# Patient Record
Sex: Female | Born: 1980 | Race: Black or African American | Hispanic: No | Marital: Single | Smoking: Former smoker
Health system: Southern US, Community
[De-identification: ages and names within clinical notes are randomized; demographics above are authoritative.]

## PROBLEM LIST (undated history)

## (undated) DIAGNOSIS — F17200 Nicotine dependence, unspecified, uncomplicated: Secondary | ICD-10-CM

## (undated) HISTORY — DX: Nicotine dependence, unspecified, uncomplicated: F17.200

---

## 2006-06-11 ENCOUNTER — Encounter (INDEPENDENT_AMBULATORY_CARE_PROVIDER_SITE_OTHER): Payer: Self-pay | Admitting: *Deleted

## 2006-06-11 ENCOUNTER — Inpatient Hospital Stay (HOSPITAL_COMMUNITY): Admission: AD | Admit: 2006-06-11 | Discharge: 2006-06-15 | Payer: Self-pay | Admitting: Obstetrics and Gynecology

## 2006-06-15 ENCOUNTER — Inpatient Hospital Stay (HOSPITAL_COMMUNITY): Admission: AD | Admit: 2006-06-15 | Discharge: 2006-06-17 | Payer: Self-pay | Admitting: Obstetrics and Gynecology

## 2007-01-26 ENCOUNTER — Emergency Department (HOSPITAL_COMMUNITY): Admission: EM | Admit: 2007-01-26 | Discharge: 2007-01-26 | Payer: Self-pay | Admitting: *Deleted

## 2007-03-21 IMAGING — US US FETAL BPP W/O NONSTRESS
1 series · 13 of 28 positions shown · non-contrast
Comparison: none

CLINICAL DATA: 34 weeks estimated gestational age with hypertension.  Assess fetal well-being.

[Series 1: us fetal bpp w/o nonstress · 0.33mm/px · 13 of 56 slices shown]
[im 3/56]
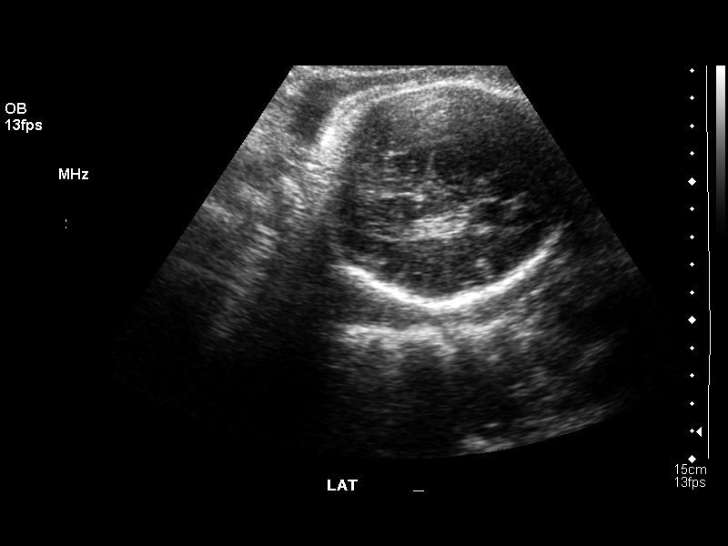
[im 7/56]
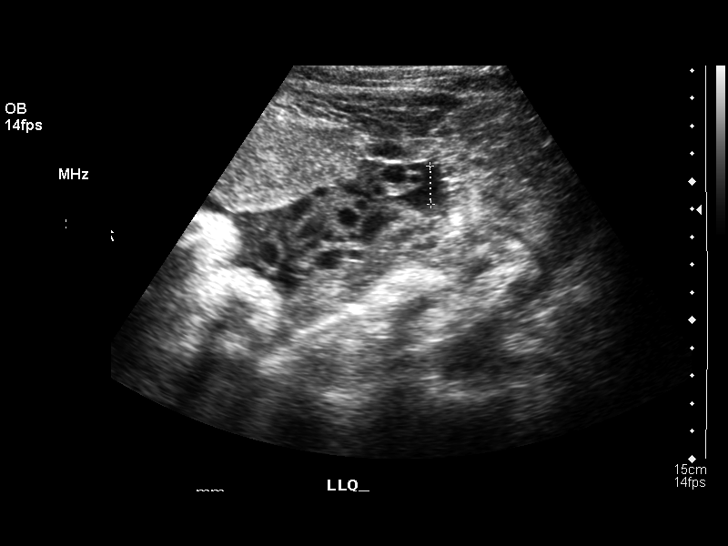
[im 11/56]
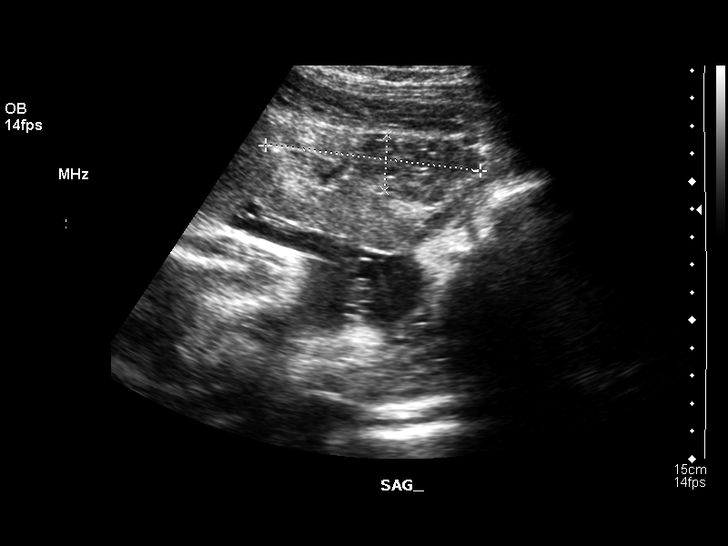
[im 15/56]
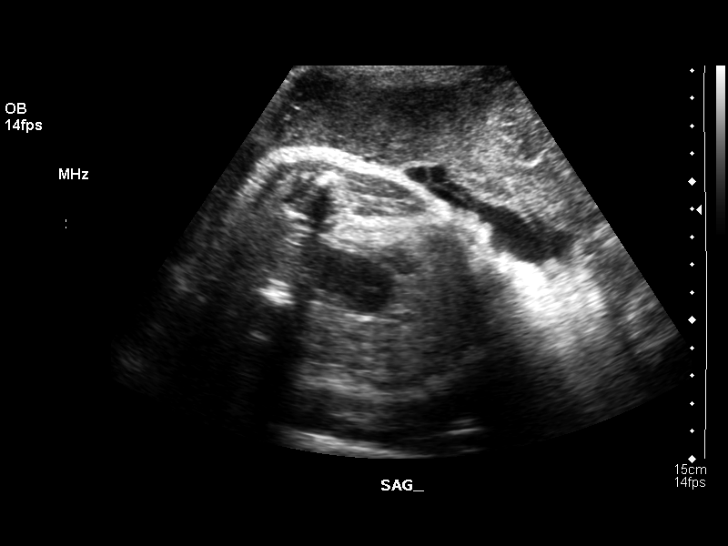
[im 19/56]
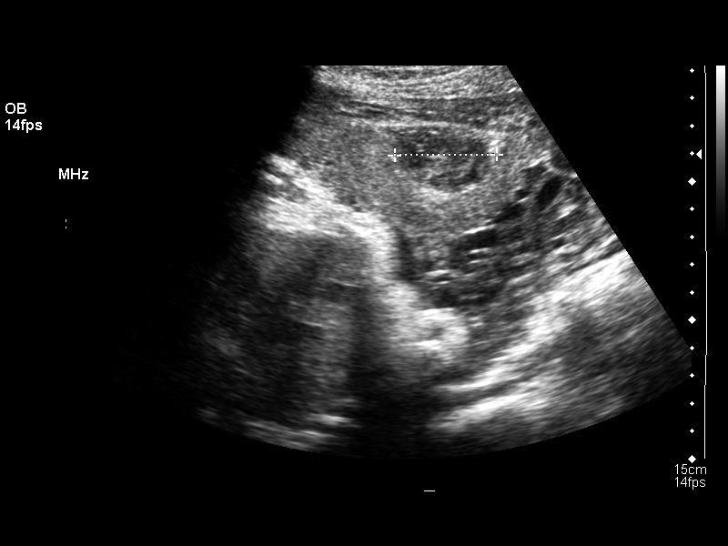
[im 23/56]
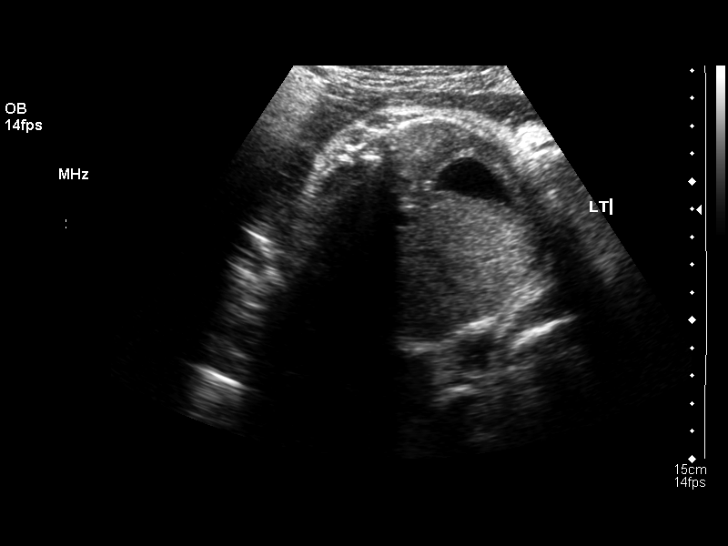
[im 29/56]
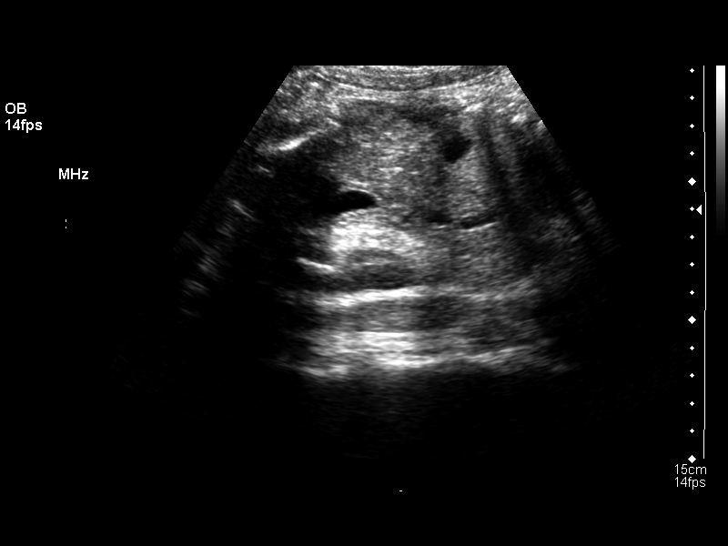
[im 33/56]
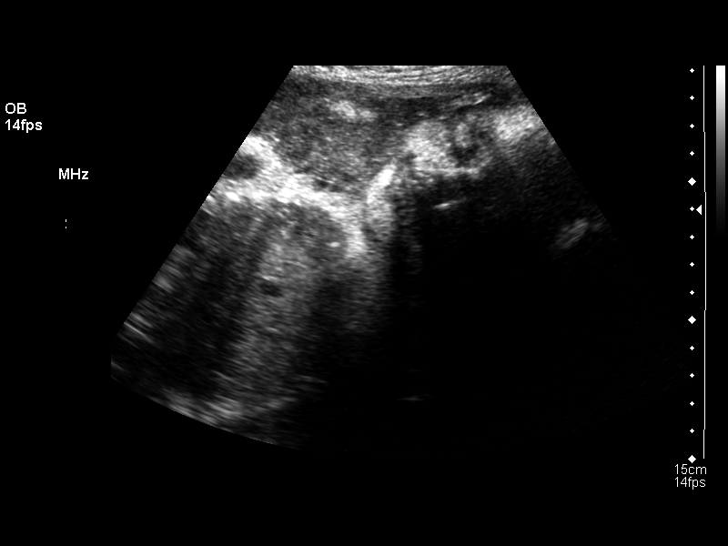
[im 37/56]
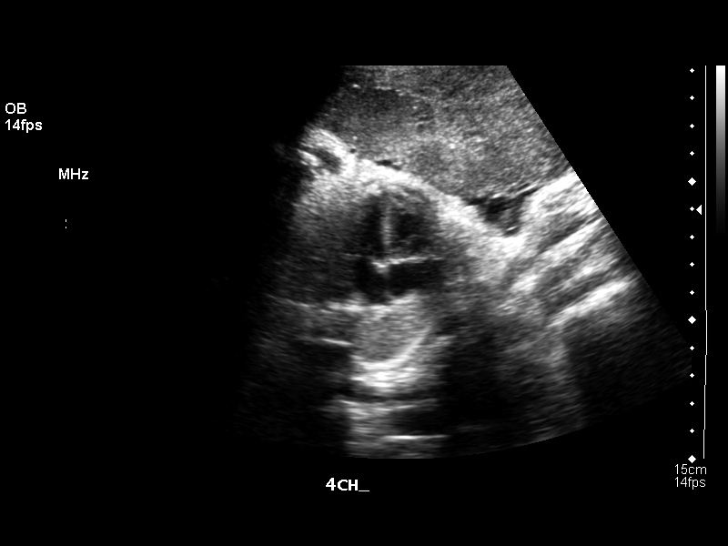
[im 41/56]
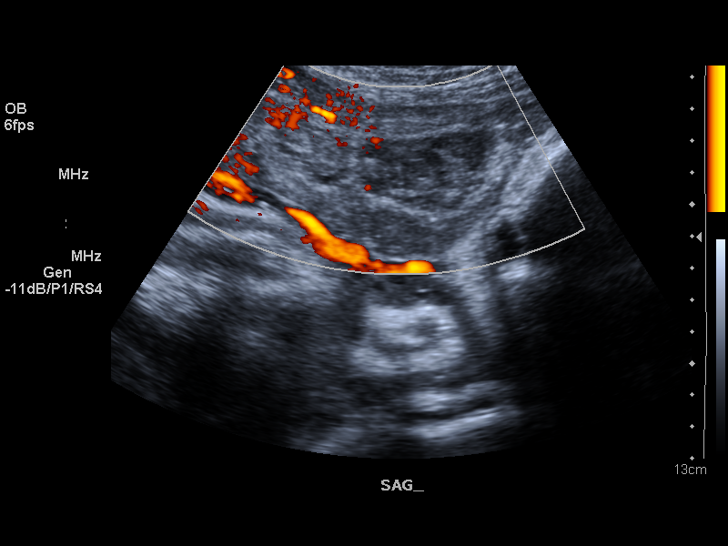
[im 45/56]
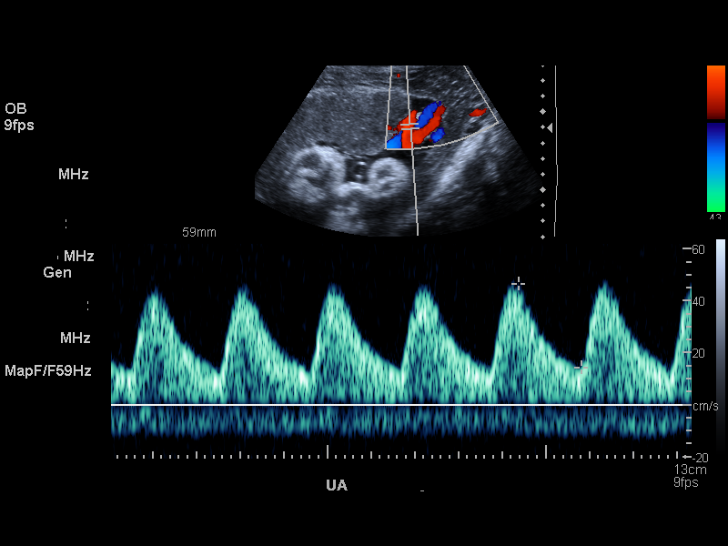
[im 49/56]
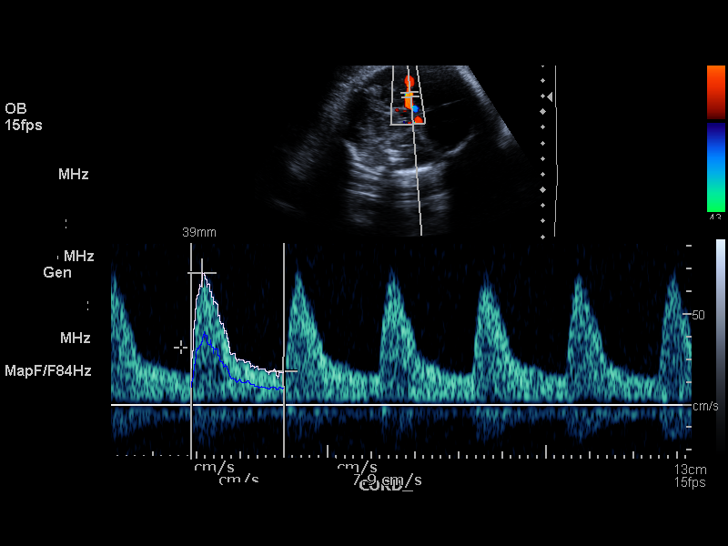
[im 53/56]
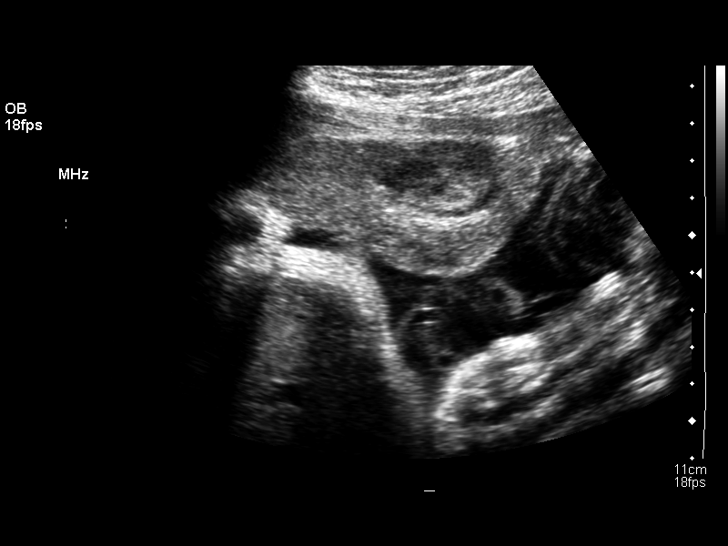

[13 of 28 positions shown; findings below may reference images not displayed]

BIOPHYSICAL PROFILE AND DOPPLER US OF FETUS:

Number of Fetuses:  1
Heart rate:  144 bpm
Movement:  yes
Breathing:  yes
Presentation:  vertex
Placental Location:  anterior
Grade:  I
Previa:  no
Amniotic Fluid (Subjective):  decreased
Amniotic Fluid (Objective):  AFI  7.76 cm (2th-62th %ile = 8.1 to 24.8 cm for 34 weeks) 

Fetal measurements and complete anatomic evaluation were not requested.  The following fetal anatomy was visualized on this exam:  Lateral ventricles, 4-chamber heart, stomach, 3-vessel cord, kidneys, bladder, and aortic arch.

BPP SCORING
Movements:  2  Time:  15 minutes
Breathing:  2
Tone:  2
Amniotic Fluid:  2
Total Score:  8

DOPPLER ULTRASOUND OF FETUS:

Umbilical Artery S/D Ratio:  3.72 (NL< 3.57)

Middle Cerebral Artery PI:  1.58  (NL> 1.35)

MATERNAL UTERINE AND ADNEXAL FINDINGS
Cervix:  Not evaluated.
An inhomogeneous avascular soft tissue collection is identified in a retroplacental position near the placental margin measuring 7.8 x 2.0 x 3.7 cm.  The appearance is worrisome for an old subacute retroplacental hematoma.
IMPRESSION: 1.  Biophysical profile score [DATE].
2.  Elevated umbilical artery systolic/diastolic ratio with a normal middle cerebral artery pulsatility index.  No absence or reversal of end diastolic flow is noted on any of the obtained strips.
3.  Subjectively and quantitatively decreased amniotic fluid volume with an amniotic fluid index today measured at 7.7 cm (8.1 to 24.8 cm).
4.  Findings worrisome for a subacute retroplacental hematoma as described above.  This study was performed as an after hours procedure and subsequently real-time evaluation by a radiologist was not possible.  The patient was sent with a preliminary copy of today?s results.

## 2007-12-11 ENCOUNTER — Other Ambulatory Visit: Admission: RE | Admit: 2007-12-11 | Discharge: 2007-12-11 | Payer: Self-pay | Admitting: Gynecology

## 2008-12-15 ENCOUNTER — Ambulatory Visit: Payer: Self-pay | Admitting: Women's Health

## 2008-12-15 ENCOUNTER — Encounter: Payer: Self-pay | Admitting: Women's Health

## 2008-12-15 ENCOUNTER — Other Ambulatory Visit: Admission: RE | Admit: 2008-12-15 | Discharge: 2008-12-15 | Payer: Self-pay | Admitting: Gynecology

## 2009-01-17 ENCOUNTER — Ambulatory Visit: Payer: Self-pay | Admitting: Women's Health

## 2009-04-07 ENCOUNTER — Ambulatory Visit: Payer: Self-pay | Admitting: Women's Health

## 2009-07-03 ENCOUNTER — Ambulatory Visit: Payer: Self-pay | Admitting: Women's Health

## 2011-03-22 NOTE — Discharge Summary (Signed)
NAMEJENYA, PUTZ              ACCOUNT NO.:  [id number]   MEDICAL RECORD NO.:  [id number]          PATIENT TYPE:  INP   LOCATION:  9320                          [facility]   PHYSICIAN:  Maxie Better, M.D.DATE OF BIRTH:  1981-09-13   DATE OF ADMISSION:  06/11/2006  DATE OF DISCHARGE:  06/15/2006                                 DISCHARGE SUMMARY   ADMISSION DIAGNOSES:  1. Severe preeclampsia.  2. Placenta abruptio.  3. Intrauterine gestation at 34+ weeks.   DISCHARGE DIAGNOSES:  1. Intrauterine gestation at 34+ weeks, delivered.  2. Severe preeclampsia.  3. Placenta abruptio.   PROCEDURE:  A primary cesarean section.   DISCHARGE DIAGNOSES:  1. Severe preeclampsia.  2. Placenta abruption.  3. Oligohydramnios.  4. Intrauterine gestation at 34+ weeks, delivered.   HISTORY OF PRESENT ILLNESS:  A 30 year old, gravida 2, para 0, female at 40-  1/7 weeks, admitted for elevation of blood pressure and proteinuria in the  office.  The patient presented for routine obstetrical care and denied any  headache, visual symptoms, or epigastric pain.  She had no prior history of  hypertension.   HOSPITAL COURSE:  The patient was admitted to the antepartum service.  PIH  labs were obtained that revealed the platelet count of 142,000.  Her  remaining labs were unremarkable.  The patient was given 20 mg of IV  labetalol.  A 24-hour urine collection was started and a biophysical profile  with Dopplers were ordered.  The radiological study was notable for  biophysical of 8/8 with possible small abruption and oligohydramnios.  The  tracing had a fetal heart rate baseline of 140 and reactivity with 1 late  deceleration  and occasional contractions.  The cervix was long, fingertip  and closed.  Presenting part was noted out of pelvis. Given the clinical  findings, the decision was made to proceed with a primary cesarean section.  The patient was taken to the operating room where she  underwent a C-section  via kerr hysterotomy.  The procedure resulted in the delivery of a live  female, 8 pounds 15 ounces.  Apgars were 8 and 8 with about a 40% abruption  noted in the anterior placenta.  A true knot in the cord and a shortened  cord as well.  Normal tubes and ovaries were noted.  The patient was  transferred to the AICU for magnesium sulfate management.  She had  hydrochlorothiazide added to her antihypertensive regimen.  Her blood  pressures have ranged from 140 to 150/92 to 108.  Repeat PIH labs were  obtained.  When she began to diurese, the patient was discharged from the  unit.  She remained in the hospital for continued blood pressure control.  By postop day #4, her blood pressures were 140 to 150/90s.  Her incision had  no erythema, induration, or exudate.  She still had a 1+ edema bilaterally  to the ankles.  Deep tendon reflexes were 2+.  She was thought to be well  enough to be discharged home.   DISPOSITION:  Home.   CONDITION:  Stable.   DISCHARGE  MEDICATIONS:  1. Labetalol 200 mg p.o. t.i.d.  2. Hydrochlorothiazide 50 mg p.o. daily.  3. Percocet 1 to 2 tablets every 3 to 4 hours p.r.n. pain.  4. Motrin 800 mg 1 p.o. q. 6 h. p.r.n. pain.   DISCHARGE FOLLOW UP:  [facility] in the office in 1 week for repeat blood  pressure check and 6 weeks postpartum.   DISCHARGE INSTRUCTIONS:  Per the postpartum booklet given, in addition,  reiteration of PIH warnings.      Maxie Better, M.D.  Electronically Signed     [place]/MEDQ  D:  07/31/2006  T:  08/01/2006  Job:  [id number]

## 2011-03-22 NOTE — Op Note (Signed)
NAMEKAMORIA, LUCIEN              ACCOUNT NO.:  [id number]   MEDICAL RECORD NO.:  [id number]          PATIENT TYPE:  INP   LOCATION:  9159                          [facility]   PHYSICIAN:  Maxie Better, M.D.DATE OF BIRTH:  1981/08/21   DATE OF PROCEDURE:  06/11/2006  DATE OF DISCHARGE:                                 OPERATIVE REPORT   PREOPERATIVE DIAGNOSIS:  Severe pre-eclampsia, placenta abruption,  intrauterine gestation at 2 2/7 weeks.   POSTOPERATIVE DIAGNOSIS:  Severe pre-eclampsia, placenta abruption,  intrauterine gestation at 34 plus weeks.   PROCEDURE:  Primary cesarean section.   ANESTHESIA:  Spinal.   SURGEON:  Maxie Better, M.D.   ASSISTANT:  None.   INDICATIONS FOR PROCEDURE:  This is a 30 year old G2, P0, female at 35 plus  weeks gestation who presented for routine obstetrical care on August 8 who  was found to have a blood pressure of 160/110 and urine protein dip stick of  greater than 500.  The patient also has had a 10 pound weight gain since her  last visit 11 days ago.  The patient had no headaches, blurred vision, or  epigastric pain.  She was sent to the maternity admissions where [facility] labs  were obtained which were notable for a slightly low platelet count, normal  liver function studies, uric acid elevation of 6.3.  The patient was  transferred to the antepartum service.  She was placed on continuous fetal  monitoring which showed nonreactive stress test with occasional contractions  with one late deceleration.  The patient underwent an ultrasound that showed  oligohydramnios and possible placental abruption.  She had an elevated S/D  ratio, biophysical profile was 8 out of 8.  Given the findings, the  recommendation was made for a primary cesarean section as the patient had an  unfavorable cervix which was long, closed, and high in the office.  The  patient had been started on magnesium sulfate on admission to the antepartum  service.  She was also given two doses of intravenous Labetalol for  management of her hypertension.  The risks and benefits of cesarean section  were reviewed with the patient and her significant other, consent was  signed, and the patient was transferred to the operating room.   PROCEDURE:  Under adequate spinal anesthesia, the patient was placed in a  supine position with a left lateral tilt.  She was sterilely prepped and  draped in the usual fashion.  An indwelling Foley catheter was sterilely  placed.  A Pfannenstiel skin incision was then made, carried down to the  rectus fascia, the rectus fascia was opened transversely.  The rectus fascia  was then bluntly and sharply dissected off the rectus muscle in a superior  and inferior fashion.  The rectus muscle was split in the midline and the  parietal peritoneum was opened sharply and extended.  The vesicouterine  peritoneum was opened transversely.  The bladder was carefully bluntly  dissected off the lower uterine segment and displaced inferiorly.  A  curvilinear low transverse uterine incision was then made and extended  bilaterally using  bandage scissors.  Artificial rupture of membranes was  noted, clear scant amniotic fluid was noted.  Subsequent delivery of a live  female was accomplished.  The baby was bulb suctioned on the abdomen, the  cord was clamped and cut, the baby was transferred to the awaiting  pediatricians who assigned Apgars of 8 and 8 at 1 and 5 minutes.  A true  knot was noted in the cord as well as a short cord.  The placenta, which was  spontaneously, showed evidence of about 40% abruption.  The placenta was  sent to pathology.   The uterine cavity was cleaned of debris.  The uterine incision had no  extension.  It was closed in two layers, the first layer 0 Monocryl running  locked stitch, the second layer was imbricated using 0 Monocryl sutures.  Small bleeders were cauterized.  A figure-of-eight was placed  on the right  aspect of the incision due to ongoing bleeding.  With good hemostasis noted,  the abdomen was copiously irrigated and suctioned of debris.  Normal tubes  and ovaries were noted bilaterally.  With good hemostasis noted, the  parietal peritoneum was closed with 2-0 Vicryl.  The rectus fascia was  closed with 0 Vicryl x 2 after small bleeders were cauterized on the rectus  muscles.  The subcutaneous areas were irrigated and suctioned.  Small  bleeders were cauterized.  2-0 plain interrupted sutures were then used to  reapproximate the subcutaneous fat.  The skin was reapproximated using  Ethicon staples.  The specimen was placenta was sent to pathology.  Estimated blood loss was 700 mL.  Interoperative fluid was 1700 mL  crystalloid.  Urine output was 200 mL clear yellow urine.  Sponge and  instrument counts x2 were correct.  Complications were none.  The patient  tolerated the procedure well and was transferred to the recovery room in  stable condition.  The baby was transferred to the NICU with a weight of 3  pounds 15 ounces.      Maxie Better, M.D.  Electronically Signed     [place]/MEDQ  D:  06/11/2006  T:  06/11/2006  Job:  [id number]

## 2011-03-22 NOTE — H&P (Signed)
Hannah Costa, Hannah Costa              ACCOUNT NO.:  [id number]   MEDICAL RECORD NO.:  [id number]          PATIENT TYPE:  INP   LOCATION:  9306                          [facility]   PHYSICIAN:  Richardean Sale, M.D.   DATE OF BIRTH:  08-25-1981   DATE OF ADMISSION:  06/15/2006  DATE OF DISCHARGE:                                HISTORY & PHYSICAL   ADMISSION DIAGNOSES:  Postoperative day #4, status post cesarean section for  severe preeclampsia with headache and elevated blood pressure.   HISTORY OF PRESENT ILLNESS:  This is a 30 year old African-American who is  status post cesarean section at approximately 34 weeks for severe  preeclampsia.  The patient was given magnesium sulfate after delivery and  was discharged to home earlier today on labetalol 200 mg p.o. 3 times a day  and hydrochlorothiazide 50 mg p.o. daily for control of blood pressure.  The  patient went to the pharmacy to fill her prescription at which time she  noticed a frontal headache.  She took her blood pressure, and her diastolic  blood pressure was 117.  She subsequently was sent to maternity admissions.  Upon arrival, she still has a mild to mild frontal headache.  No blurry  vision.  No focal neurologic symptoms.  She denies any epigastric pain.  She  denies any chest pain, shortness of breath, fever, chills, sweats.   PAST MEDICAL HISTORY:  No prior history of hypertension or other  hospitalizations.   PAST SURGICAL HISTORY:  1. Recent cesarean section.  2. D&C for TAB.   OBSTETRIC HISTORY:  Gravida 2, para 0-1-1-1.  Cesarean section at 34 weeks 4  days ago and TAB at 9 weeks.   GYNECOLOGIC HISTORY:  Positive for abnormal Pap smear in 2003.  No history  of sexually transmitted infections.   SOCIAL HISTORY:  She denies tobacco, alcohol, or drugs.   FAMILY HISTORY:  Positive for chronic hypertension, diabetes, seizure  disorder, thrombophlebitis.   MEDICATIONS:  1. Labetalol 200 mg p.o. 3 times a  day.  2. Hydrochlorothiazide 50 mg p.o. daily.  3. Percocet 1-2 tablets p.o. q. 4-6 h p.r.n. pain.  4. Motrin 800 mg p.o. q. 8 h p.r.n. pain.  5. Prenatal vitamin.   PHYSICAL EXAMINATION:  VITAL SIGNS:  Upon arrival, blood pressure is  175/113, down to 165/101.  Pulse 70s to 80s.  Respirations 18.  GENERAL: Well-developed, well-nourished black female who appears in no acute  distress.  HEART: Regular rate and rhythm.  LUNGS: Clear to auscultation bilaterally.  ABDOMEN: Soft, appropriately tender status post C section, nondistended.  No  palpable masses.  Incision is clean, dry, and intact with Steri-Strips.  EXTREMITIES:  No cyanosis or clubbing, 2+ edema in the feet bilaterally.  NEUROLOGIC: Exam is nonfocal, 2+ reflexes bilaterally, and no clonus.   LABORATORY STUDIES:  White count 12.3, hemoglobin 10.2, hematocrit 30.8,  platelet count 135.  AST 40, ALT 23, potassium 4.4.  LDH 290.  Uric acid  6.5.  Urine is negative for protein.   ASSESSMENT:  A 30 year old, gravida 2, para 0-1-1-1, who is now postop  day  #4 status post cesarean section for severe preeclampsia status post  treatment with magnesium sulfate, now with headache and elevated blood  pressure.   PLAN:  1. Will readmit to the third floor for monitoring of blood pressure.  2. Continue labetalol 200 mg p.o. 3 times a day and hydrochlorothiazide 50      mg p.o. daily.  3. Will administer hydralazine 10 mg IV x1.  4. Lactated Ringers at 125 mL an hour.  5. Monitor closely for worsening headache, blurred vision, or epigastric      pain.  6. If blood pressure is unable to be controlled, will consult internal      medicine for additional recommendations.      Richardean Sale, M.D.  Electronically Signed     [facility]/MEDQ  D:  06/15/2006  T:  06/15/2006  Job:  [id number]

## 2011-03-22 NOTE — Discharge Summary (Signed)
Hannah Costa, Hannah Costa              ACCOUNT NO.:  [id number]   MEDICAL RECORD NO.:  [id number]          PATIENT TYPE:  INP   LOCATION:  9306                          [facility]   PHYSICIAN:  Richardean Sale, M.D.   DATE OF BIRTH:  09/13/1981   DATE OF ADMISSION:  06/15/2006  DATE OF DISCHARGE:  06/17/2006                                 DISCHARGE SUMMARY   ADMISSION DIAGNOSIS:  Postpartum hypertension.   DISCHARGE DIAGNOSIS:  Postpartum hypertension.   HOSPITAL COURSE AND HISTORY OF PRESENT ILLNESS:  Please see admission  history and physical for details.   Briefly, this is a 30 year old African-American female who is status post  cesarean section who was discharged to home earlier on the day of this  admission on oral antihypertensive medications as she developed preeclampsia  during this pregnancy.  The patient was discharged to home that morning.  She called later that day when she went to the pharmacy to have her blood  pressure medications filled and her blood pressure was elevated with a  diastolic pressure of 117.  The patient subsequently returned to the  hospital and was admitted for observation and antihypertensive medications.  The patient was subsequently discharged to home on hospital day #3/postop  day #6 after her blood pressure stabilized.   DISPOSITION:  To home.   DISCHARGE CONDITION:  Improved.   DISCHARGE FOLLOWUP:  The patient will follow up in 2-3 days for a blood  pressure check in the office.   DISCHARGE MEDICATIONS:  1. Labetalol 200 mg p.o. three times a day.  2. Hydrochlorothiazide 50 mg p.o. daily.  3. Percocet one to two tabs p.o. q.4-6h. p.r.n. pain.  4. Motrin 800 mg p.o. q.8h. p.r.n. pain.  5. Daily prenatal vitamins.   DISCHARGE INSTRUCTIONS:  The patient is to call for headache, visual  changes, epigastric pain or any blood pressure greater than 160/105.   LABORATORY STUDIES:  On admission, white count 12.3, hemoglobin 10.2,  hematocrit 30.8, platelets 135, AST 40, ALT 23, LDH 290 and uric acid 6.5.      Richardean Sale, M.D.  Electronically Signed     [facility]/MEDQ  D:  08/02/2006  T:  08/04/2006  Job:  [id number]

## 2011-03-22 NOTE — H&P (Signed)
Hannah Costa, Hannah Costa NO.:  [id number]   MEDICAL RECORD NO.:  [id number]          PATIENT TYPE:  MAT   LOCATION:  MATC                          [facility]   PHYSICIAN:  Lenoard Aden, M.D.DATE OF BIRTH:  1981-05-21   DATE OF ADMISSION:  06/11/2006  DATE OF DISCHARGE:                                HISTORY & PHYSICAL   CHIEF COMPLAINT:  Elevated blood pressure.   She is a 30 year old [place] American female, G-2, P-0, with a history of  TAB times one who presents at 17 and 1/7 weeks with elevated blood pressure  and proteinuria in the office.  She denies headaches, visual symptoms or  epigastric pain.  She has no history of elevated blood pressure in the past.   ALLERGIES:  No known drug allergies.   MEDICATIONS:  Prenatal vitamins.   She has a history of abnormal Pap smear in 2003.  She is a nonsmoker and  nondrinker.  She denies domestic physical violence.  She has a family  history of chronic hypertension, diabetes, seizure disorder,  thrombophlebitis.  She has a pertinent history remarkable for a TAB at 9  weeks without complications.  Prenatal lab data is all within normal limits.   PHYSICAL EXAMINATION:  She is a well-developed and well-nourished [place]  American female in no acute distress.  HEENT:  Normal.  NECK:  Supple with full range of motion.  LUNGS:  Clear to auscultation.  HEART:  Regular rate and rhythm.  ABDOMEN:  Soft, gravid, nontender.  No right upper quadrant tenderness.  No  epigastric tenderness noted.  No CVA tenderness noted.  SKIN:  Intact.  NEUROLOGIC:  Exam nonfocal.  DTRs 2-3+ with 1+ pretibial edema and no clonus  noted.   Cervix per Dr. Cherly Hensen is fingertip and vertex per office exam.  NST is  reassuring, but at this time nonreactive, irregular contractions noted.  Biophysical profile and Doppler pending.  PIH panel reveals a platelet count  of 142,000.  Otherwise hemoglobin of 12.  LFTs, creatinine, metabolic  profile, LDH, uric acid all within normal limits with a uric acid of 6.3.   IMPRESSION:  1. A 34 and 1/7 weeks intrauterine pregnancy.  2. Probable severe preeclampsia.   PLAN:  Will admit.  Start 24-hour urine.  Administer labetalol 20 mg IV.  Biophysical profile with Doppler.  Strict I and Os.  Anticipate attempt at  delivery.      Lenoard Aden, M.D.  Electronically Signed     [facility]/MEDQ  D:  06/11/2006  T:  06/11/2006  Job:  [id number]   cc:   Lenoard Aden, M.D.  Fax: [phone]

## 2011-04-05 ENCOUNTER — Other Ambulatory Visit (HOSPITAL_COMMUNITY)
Admission: RE | Admit: 2011-04-05 | Discharge: 2011-04-05 | Disposition: A | Discharge status: Home / Self Care | Payer: 59 | Source: Ambulatory Visit | Attending: Gynecology | Admitting: Gynecology

## 2011-04-05 ENCOUNTER — Other Ambulatory Visit: Payer: Self-pay | Admitting: Women's Health

## 2011-04-05 ENCOUNTER — Encounter (INDEPENDENT_AMBULATORY_CARE_PROVIDER_SITE_OTHER): Payer: 59 | Admitting: Women's Health

## 2011-04-05 DIAGNOSIS — Z113 Encounter for screening for infections with a predominantly sexual mode of transmission: Secondary | ICD-10-CM

## 2011-04-05 DIAGNOSIS — Z01419 Encounter for gynecological examination (general) (routine) without abnormal findings: Secondary | ICD-10-CM

## 2011-04-05 DIAGNOSIS — B373 Candidiasis of vulva and vagina: Secondary | ICD-10-CM

## 2011-04-05 DIAGNOSIS — Z3049 Encounter for surveillance of other contraceptives: Secondary | ICD-10-CM

## 2011-04-05 DIAGNOSIS — N390 Urinary tract infection, site not specified: Secondary | ICD-10-CM

## 2011-04-05 DIAGNOSIS — Z124 Encounter for screening for malignant neoplasm of cervix: Secondary | ICD-10-CM | POA: Insufficient documentation

## 2011-05-01 ENCOUNTER — Ambulatory Visit: Payer: 59 | Admitting: Gynecology

## 2011-10-11 ENCOUNTER — Telehealth: Payer: Self-pay | Admitting: Women's Health

## 2011-10-11 NOTE — Telephone Encounter (Signed)
Telephone call for followup. Had called the on-call doctor about itching on thigh. Saw a dermatologist is now better. But is having slight vaginal itching and requested a Diflucan. Diflucan called to [facility] will call if no relief.

## 2011-10-15 ENCOUNTER — Ambulatory Visit: Payer: 59 | Admitting: Gynecology

## 2011-10-18 ENCOUNTER — Ambulatory Visit: Payer: 59 | Admitting: Women's Health

## 2011-11-01 ENCOUNTER — Other Ambulatory Visit: Payer: Self-pay

## 2011-12-13 ENCOUNTER — Encounter: Payer: Self-pay | Admitting: Women's Health

## 2011-12-13 ENCOUNTER — Ambulatory Visit (INDEPENDENT_AMBULATORY_CARE_PROVIDER_SITE_OTHER): Payer: 59 | Admitting: Women's Health

## 2011-12-13 DIAGNOSIS — L738 Other specified follicular disorders: Secondary | ICD-10-CM

## 2011-12-13 DIAGNOSIS — IMO0001 Reserved for inherently not codable concepts without codable children: Secondary | ICD-10-CM

## 2011-12-13 DIAGNOSIS — L739 Follicular disorder, unspecified: Secondary | ICD-10-CM

## 2011-12-13 DIAGNOSIS — Z309 Encounter for contraceptive management, unspecified: Secondary | ICD-10-CM

## 2011-12-13 MED ORDER — MEDROXYPROGESTERONE ACETATE 150 MG/ML IM SUSP: 150.0000 mg | Freq: Once | INTRAMUSCULAR | Status: DC

## 2011-12-13 MED ORDER — CEPHALEXIN 500 MG PO CAPS: 500.0000 mg | ORAL_CAPSULE | Freq: Three times a day (TID) | ORAL | Status: AC

## 2011-12-13 NOTE — Progress Notes (Signed)
Patient ID: Hannah Costa, female   DOB: 30-Apr-1981, 30 y.o.   MRN: [id number] Presents with the complaint of skin irritation with itching and tenderness on the thighs and buttocks area. Was treated for scabies by dermatologist 3 times. Also requested to start back on Depo-Provera has used in the past without problem. Same partner.  Exam: Healed areas under the axilla,  groin area, inner thighs.Several areas appear to be infected,  folliculitis, tender. No visible scabies noted with visual exam. Skin scraping from area causing itching looked under microscope no visible scabies.  Folliculitis/resolved scabies Contraception management  Plan: Depo-Provera 150 prescription, proper use, currently on cycle instructed to return with medication. Instructed to use condoms especially first month and for infection control. Keflex 500 by mouth 3 times a day for 7 days, instructed to return to office if areas do not resolve. Instructed to wear cotton, loose clothing open to air as able. Hand washing encouraged

## 2011-12-16 ENCOUNTER — Ambulatory Visit (INDEPENDENT_AMBULATORY_CARE_PROVIDER_SITE_OTHER): Payer: 59

## 2011-12-16 DIAGNOSIS — Z3049 Encounter for surveillance of other contraceptives: Secondary | ICD-10-CM

## 2011-12-16 MED ORDER — MEDROXYPROGESTERONE ACETATE 150 MG/ML IM SUSP
150.0000 mg | Freq: Once | INTRAMUSCULAR | Status: AC
  Administered 2011-12-16: 150 mg via INTRAMUSCULAR

## 2012-02-07 ENCOUNTER — Telehealth: Payer: Self-pay | Admitting: *Deleted

## 2012-02-07 ENCOUNTER — Ambulatory Visit: Payer: 59 | Admitting: Women's Health

## 2012-02-07 NOTE — Telephone Encounter (Signed)
Left message for pt to call.

## 2012-02-07 NOTE — Telephone Encounter (Signed)
Pt left message c/o skin infection still there? Left message for pt to call.

## 2012-02-07 NOTE — Telephone Encounter (Signed)
Pt informed to make OV per Harriett Sine note.

## 2012-03-09 ENCOUNTER — Ambulatory Visit (INDEPENDENT_AMBULATORY_CARE_PROVIDER_SITE_OTHER): Payer: 59 | Admitting: *Deleted

## 2012-03-09 DIAGNOSIS — Z3049 Encounter for surveillance of other contraceptives: Secondary | ICD-10-CM

## 2012-03-09 MED ORDER — MEDROXYPROGESTERONE ACETATE 150 MG/ML IM SUSP
150.0000 mg | Freq: Once | INTRAMUSCULAR | Status: AC
  Administered 2012-03-09: 150 mg via INTRAMUSCULAR

## 2012-08-14 ENCOUNTER — Encounter: Payer: Self-pay | Admitting: Women's Health

## 2012-08-14 ENCOUNTER — Ambulatory Visit (INDEPENDENT_AMBULATORY_CARE_PROVIDER_SITE_OTHER): Payer: 59 | Admitting: Women's Health

## 2012-08-14 VITALS — BP 122/78 | Ht 67.0 in | Wt 169.0 lb

## 2012-08-14 DIAGNOSIS — Z01419 Encounter for gynecological examination (general) (routine) without abnormal findings: Secondary | ICD-10-CM

## 2012-08-14 DIAGNOSIS — Z309 Encounter for contraceptive management, unspecified: Secondary | ICD-10-CM

## 2012-08-14 DIAGNOSIS — IMO0001 Reserved for inherently not codable concepts without codable children: Secondary | ICD-10-CM

## 2012-08-14 DIAGNOSIS — N898 Other specified noninflammatory disorders of vagina: Secondary | ICD-10-CM

## 2012-08-14 DIAGNOSIS — Z113 Encounter for screening for infections with a predominantly sexual mode of transmission: Secondary | ICD-10-CM

## 2012-08-14 LAB — CBC WITH DIFFERENTIAL/PLATELET
Basophils Absolute: 0 10*3/uL (ref 0.0–0.1)
Eosinophils Relative: 1 % (ref 0–5)
HCT: 39 % (ref 36.0–46.0)
Lymphocytes Relative: 37 % (ref 12–46)
MCV: 81.4 fL (ref 78.0–100.0)
Monocytes Absolute: 0.9 10*3/uL (ref 0.1–1.0)
RDW: 14.2 % (ref 11.5–15.5)
WBC: 7.9 10*3/uL (ref 4.0–10.5)

## 2012-08-14 LAB — WET PREP FOR TRICH, YEAST, CLUE: Trich, Wet Prep: NONE SEEN

## 2012-08-14 MED ORDER — METRONIDAZOLE 0.75 % VA GEL: VAGINAL | Status: DC

## 2012-08-14 MED ORDER — FLUCONAZOLE 150 MG PO TABS: 150.0000 mg | ORAL_TABLET | Freq: Once | ORAL | Status: DC

## 2012-08-14 MED ORDER — MEDROXYPROGESTERONE ACETATE 150 MG/ML IM SUSP: 150.0000 mg | Freq: Once | INTRAMUSCULAR | Status: DC

## 2012-08-14 NOTE — Patient Instructions (Signed)

## 2012-08-14 NOTE — Progress Notes (Signed)
Karyssa Tozer 1981-05-26 [id number]    History:    The patient presents for annual exam. Has had about a 30 pound weight gain in the last 3 years. Currently on Depo-Provera 150, missed August injection, has not been sexually active for greater than 6 months. History of normal Paps. 1 gardasil 2009   Past medical history, past surgical history, family history and social history were all reviewed and documented in the EPIC chart. Works at [employer]. Daughter Caprice Red 6 doing well. Mother numerous health problems on dialysis/noncompliant with care.   ROS:  A  ROS was performed and pertinent positives and negatives are included in the history.  Exam:  Filed Vitals:   08/14/12 1433  BP: 122/78    General appearance:  Normal Head/Neck:  Normal, without cervical or supraclavicular adenopathy. Thyroid:  Symmetrical, normal in size, without palpable masses or nodularity. Respiratory  Effort:  Normal  Auscultation:  Clear without wheezing or rhonchi Cardiovascular  Auscultation:  Regular rate, without rubs, murmurs or gallops  Edema/varicosities:  Not grossly evident Abdominal  Soft,nontender, without masses, guarding or rebound.  Liver/spleen:  No organomegaly noted  Hernia:  None appreciated  Skin  Inspection:  Grossly normal  Palpation:  Grossly normal Neurologic/psychiatric  Orientation:  Normal with appropriate conversation.  Mood/affect:  Normal  Genitourinary    Breasts: Examined lying and sitting.     Right: Without masses, retractions, discharge or axillary adenopathy.     Left: Without masses, retractions, discharge or axillary adenopathy.   Inguinal/mons:  Normal without inguinal adenopathy  External genitalia:  Normal  BUS/Urethra/Skene's glands:  Normal  Bladder:  Normal  Vagina:  Wet prep positive for yeast, clues and TNTC. bacteria   Cervix:  Normal  Uterus:   normal in size, shape and contour.  Midline and mobile  Adnexa/parametria:     Rt: Without masses or  tenderness.   Lt: Without masses or tenderness.  Anus and perineum: Normal  Digital rectal exam: Normal sphincter tone without palpated masses or tenderness  Assessment/Plan:  31 y.o. SBF G3P1 for annual exam with complaint of vaginal discharge with itching and odor.    BV and yeast Contraception management  Plan: Contraception options reviewed, Mirena IUD information reviewed will check coverage, return to office for Dr. Brayton Layman to place with a cycle if covered.  If not we'll continue on Depo-Provera 150 every 12 weeks, prescription, proper use given continue to be abstinent until injection. Condoms encouraged until permanent partner. MetroGel vaginal cream 1 applicator at bedtime x5 and Diflucan 150 by mouth times one dose with refill. SBE's, exercise, calcium rich diet, MVI daily encouraged. CBC, UA, GC/Chlamydia, HIV, hepatitis B., C., RPR. No Pap history of normal Pap 2012,  reviewed new screening guidelines.   Harrington Challenger [facility], 3:57 PM 08/14/2012

## 2012-08-15 LAB — URINALYSIS W MICROSCOPIC + REFLEX CULTURE
Bacteria, UA: NONE SEEN
Bilirubin Urine: NEGATIVE
Casts: NONE SEEN
Ketones, ur: NEGATIVE mg/dL
Specific Gravity, Urine: 1.023 (ref 1.005–1.030)
pH: 6.5 (ref 5.0–8.0)

## 2013-07-09 ENCOUNTER — Other Ambulatory Visit: Payer: Self-pay | Admitting: Women's Health

## 2013-07-09 ENCOUNTER — Telehealth: Payer: Self-pay

## 2013-07-09 ENCOUNTER — Telehealth: Payer: Self-pay | Admitting: Gynecology

## 2013-07-09 MED ORDER — SULFAMETHOXAZOLE-TRIMETHOPRIM 800-160 MG PO TABS: 1.0000 | ORAL_TABLET | Freq: Two times a day (BID) | ORAL | Status: DC

## 2013-07-09 NOTE — Telephone Encounter (Signed)
Message left

## 2013-07-09 NOTE — Telephone Encounter (Signed)
Patient c/o mild UTI symptoms x 3 days.  (Of note, she had Dr. Velvet Bathe on call paged at 6:20am this morning about this. He recommended to her that she call the office at 9am and make appt.)  She is calling this afternoon because the pain has gone into her side and is becoming unbearable. However, she cannot come in because she is in training for a new job today 8:30am to 5:30pm  She wanted to know if you would prescribe Rx over the phone or recommend something.

## 2013-07-09 NOTE — Telephone Encounter (Signed)
Telephone call to review problem. States when called the on-call doctor only wanted  to leave a message. Apologized for inconvenience. States has been in training all week and  will be in all next week also. States having low abdominal pain, pressure with urination and frequency. States had UTI  several years ago but same symptoms. Denies vaginal discharge, fever. Same partner, monthly cycle. No allergies, Septra DS twice daily for 3 days #6.

## 2013-07-09 NOTE — Telephone Encounter (Signed)
On-call note 6:20 AM: Patient paged the physician on-call with 3 day history of UTI symptoms. Had tried homeopathic remedies but still was having symptoms. Patient was asked to call the office at 9 AM to be seen and evaluated.

## 2013-07-12 ENCOUNTER — Encounter: Payer: Self-pay | Admitting: Women's Health

## 2013-07-12 ENCOUNTER — Ambulatory Visit (INDEPENDENT_AMBULATORY_CARE_PROVIDER_SITE_OTHER): Payer: BC Managed Care – PPO | Admitting: Women's Health

## 2013-07-12 VITALS — Temp 101.6°F

## 2013-07-12 DIAGNOSIS — R8290 Unspecified abnormal findings in urine: Secondary | ICD-10-CM

## 2013-07-12 DIAGNOSIS — N390 Urinary tract infection, site not specified: Secondary | ICD-10-CM

## 2013-07-12 DIAGNOSIS — R82998 Other abnormal findings in urine: Secondary | ICD-10-CM

## 2013-07-12 LAB — URINALYSIS W MICROSCOPIC + REFLEX CULTURE
Bilirubin Urine: NEGATIVE
Casts: NONE SEEN
Crystals: NONE SEEN
Ketones, ur: 15 mg/dL — AB
Specific Gravity, Urine: 1.025 (ref 1.005–1.030)
pH: 6 (ref 5.0–8.0)

## 2013-07-12 MED ORDER — CIPROFLOXACIN HCL 500 MG PO TABS: 500.0000 mg | ORAL_TABLET | Freq: Two times a day (BID) | ORAL | Status: DC

## 2013-07-12 NOTE — Patient Instructions (Addendum)
Urinary Tract Infection  Urinary tract infections (UTIs) can develop anywhere along your urinary tract. Your urinary tract is your body's drainage system for removing wastes and extra water. Your urinary tract includes two kidneys, two ureters, a bladder, and a urethra. Your kidneys are a pair of bean-shaped organs. Each kidney is about the size of your fist. They are located below your ribs, one on each side of your spine.  CAUSES  Infections are caused by microbes, which are microscopic organisms, including fungi, viruses, and bacteria. These organisms are so small that they can only be seen through a microscope. Bacteria are the microbes that most commonly cause UTIs.  SYMPTOMS   Symptoms of UTIs may vary by age and gender of the patient and by the location of the infection. Symptoms in young women typically include a frequent and intense urge to urinate and a painful, burning feeling in the bladder or urethra during urination. Older women and men are more likely to be tired, shaky, and weak and have muscle aches and abdominal pain. A fever may mean the infection is in your kidneys. Other symptoms of a kidney infection include pain in your back or sides below the ribs, nausea, and vomiting.  DIAGNOSIS  To diagnose a UTI, your caregiver will ask you about your symptoms. Your caregiver also will ask to provide a urine sample. The urine sample will be tested for bacteria and white blood cells. White blood cells are made by your body to help fight infection.  TREATMENT   Typically, UTIs can be treated with medication. Because most UTIs are caused by a bacterial infection, they usually can be treated with the use of antibiotics. The choice of antibiotic and length of treatment depend on your symptoms and the type of bacteria causing your infection.  HOME CARE INSTRUCTIONS   If you were prescribed antibiotics, take them exactly as your caregiver instructs you. Finish the medication even if you feel better after you  have only taken some of the medication.   Drink enough water and fluids to keep your urine clear or pale yellow.   Avoid caffeine, tea, and carbonated beverages. They tend to irritate your bladder.   Empty your bladder often. Avoid holding urine for long periods of time.   Empty your bladder before and after sexual intercourse.   After a bowel movement, women should cleanse from front to back. Use each tissue only once.  SEEK MEDICAL CARE IF:    You have back pain.   You develop a fever.   Your symptoms do not begin to resolve within 3 days.  SEEK IMMEDIATE MEDICAL CARE IF:    You have severe back pain or lower abdominal pain.   You develop chills.   You have nausea or vomiting.   You have continued burning or discomfort with urination.  MAKE SURE YOU:    Understand these instructions.   Will watch your condition.   Will get help right away if you are not doing well or get worse.  Document Released: 07/31/2005 Document Revised: 04/21/2012 Document Reviewed: 11/29/2011  [employer] Patient Information 2014 [employer].

## 2013-07-12 NOTE — Progress Notes (Signed)
Patient ID: Natalija Mavis, female   DOB: 1981-05-02, 32 y.o.   MRN: [id number] Presents with fever for 3 days, RLQ and right sided pain, urinary frequency x 4 days and generally not feeling well.  Condoms/same partner. Called the office on Friday 9/5 with urinary symptoms and unable to come to office in work training for a new job, started septra prescription on Friday evening. She noticed some improvement in the urinary frequency but the fever persisted. Went to  employee health on Saturday where her fever was 103. She completed her course of antibiotics but symptoms have persisted. Denies nausea/vomiting,  upper respiratory symptoms, or vaginal symtpoms.  Exam: Well-appearing, abdomen soft, non-tender, non-distended. No rebound or radiation of pain with deep palpation to right lower quadrant.  No CVAT. Speculum exam, no noted erythema or discharge. Bimanual no CMT, no adnexal fullness or tenderness noted. No pain with exam.  UA: Positive nitrites, small leukocytes, 21-50 WBCs, many bacteria.  UTI resistant to Septra  Plan:  Cirpro 500mg  BID x 5 days, no written for work, instructed to rest.  Motrin as needed for fever, instructed to call if symptoms persist. Test of cure UA in 2 weeks appear

## 2014-09-05 ENCOUNTER — Encounter: Payer: Self-pay | Admitting: Women's Health

## 2015-08-11 ENCOUNTER — Encounter: Payer: Self-pay | Admitting: Women's Health

## 2015-08-11 ENCOUNTER — Other Ambulatory Visit (HOSPITAL_COMMUNITY)
Admission: RE | Admit: 2015-08-11 | Discharge: 2015-08-11 | Disposition: A | Discharge status: Home / Self Care | Payer: 59 | Source: Ambulatory Visit | Attending: Women's Health | Admitting: Women's Health

## 2015-08-11 ENCOUNTER — Ambulatory Visit (INDEPENDENT_AMBULATORY_CARE_PROVIDER_SITE_OTHER): Payer: 59 | Admitting: Women's Health

## 2015-08-11 VITALS — BP 110/72 | Ht 67.0 in | Wt 168.0 lb

## 2015-08-11 DIAGNOSIS — Z113 Encounter for screening for infections with a predominantly sexual mode of transmission: Secondary | ICD-10-CM | POA: Diagnosis not present

## 2015-08-11 DIAGNOSIS — Z1151 Encounter for screening for human papillomavirus (HPV): Secondary | ICD-10-CM | POA: Diagnosis not present

## 2015-08-11 DIAGNOSIS — Z01419 Encounter for gynecological examination (general) (routine) without abnormal findings: Secondary | ICD-10-CM | POA: Insufficient documentation

## 2015-08-11 DIAGNOSIS — Z30013 Encounter for initial prescription of injectable contraceptive: Secondary | ICD-10-CM

## 2015-08-11 LAB — WET PREP FOR TRICH, YEAST, CLUE
Clue Cells Wet Prep HPF POC: NONE SEEN
TRICH WET PREP: NONE SEEN
YEAST WET PREP: NONE SEEN

## 2015-08-11 MED ORDER — MEDROXYPROGESTERONE ACETATE 150 MG/ML IM SUSP: 150.0000 mg | Freq: Once | INTRAMUSCULAR | Status: DC

## 2015-08-11 MED ORDER — MEDROXYPROGESTERONE ACETATE 150 MG/ML IM SUSP
150.0000 mg | Freq: Once | INTRAMUSCULAR | Status: AC
  Administered 2015-08-11: 150 mg via INTRAMUSCULAR

## 2015-08-11 NOTE — Patient Instructions (Signed)
Health Maintenance, Female Adopting a healthy lifestyle and getting preventive care can go a long way to promote health and wellness. Talk with your health care provider about what schedule of regular examinations is right for you. This is a good chance for you to check in with your provider about disease prevention and staying healthy. In between checkups, there are plenty of things you can do on your own. Experts have done a lot of research about which lifestyle changes and preventive measures are most likely to keep you healthy. Ask your health care provider for more information. WEIGHT AND DIET  Eat a healthy diet  Be sure to include plenty of vegetables, fruits, low-fat dairy products, and lean protein.  Do not eat a lot of foods high in solid fats, added sugars, or salt.  Get regular exercise. This is one of the most important things you can do for your health.  Most adults should exercise for at least 150 minutes each week. The exercise should increase your heart rate and make you sweat (moderate-intensity exercise).  Most adults should also do strengthening exercises at least twice a week. This is in addition to the moderate-intensity exercise.  Maintain a healthy weight  Body mass index (BMI) is a measurement that can be used to identify possible weight problems. It estimates body fat based on height and weight. Your health care provider can help determine your BMI and help you achieve or maintain a healthy weight.  For females 20 years of age and older:   A BMI below 18.5 is considered underweight.  A BMI of 18.5 to 24.9 is normal.  A BMI of 25 to 29.9 is considered overweight.  A BMI of 30 and above is considered obese.  Watch levels of cholesterol and blood lipids  You should start having your blood tested for lipids and cholesterol at 33 years of age, then have this test every 5 years.  You may need to have your cholesterol levels checked more often if:  Your lipid  or cholesterol levels are high.  You are older than 33 years of age.  You are at high risk for heart disease.  CANCER SCREENING   Lung Cancer  Lung cancer screening is recommended for adults 55-80 years old who are at high risk for lung cancer because of a history of smoking.  A yearly low-dose CT scan of the lungs is recommended for people who:  Currently smoke.  Have quit within the past 15 years.  Have at least a 30-pack-year history of smoking. A pack year is smoking an average of one pack of cigarettes a day for 1 year.  Yearly screening should continue until it has been 15 years since you quit.  Yearly screening should stop if you develop a health problem that would prevent you from having lung cancer treatment.  Breast Cancer  Practice breast self-awareness. This means understanding how your breasts normally appear and feel.  It also means doing regular breast self-exams. Let your health care provider know about any changes, no matter how small.  If you are in your 20s or 30s, you should have a clinical breast exam (CBE) by a health care provider every 1-3 years as part of a regular health exam.  If you are 40 or older, have a CBE every year. Also consider having a breast X-ray (mammogram) every year.  If you have a family history of breast cancer, talk to your health care provider about genetic screening.  If you   are at high risk for breast cancer, talk to your health care provider about having an MRI and a mammogram every year.  Breast cancer gene (BRCA) assessment is recommended for women who have family members with BRCA-related cancers. BRCA-related cancers include:  Breast.  Ovarian.  Tubal.  Peritoneal cancers.  Results of the assessment will determine the need for genetic counseling and BRCA1 and BRCA2 testing. Cervical Cancer Your health care provider may recommend that you be screened regularly for cancer of the pelvic organs (ovaries, uterus, and  vagina). This screening involves a pelvic examination, including checking for microscopic changes to the surface of your cervix (Pap test). You may be encouraged to have this screening done every 3 years, beginning at age 21.  For women ages 30-65, health care providers may recommend pelvic exams and Pap testing every 3 years, or they may recommend the Pap and pelvic exam, combined with testing for human papilloma virus (HPV), every 5 years. Some types of HPV increase your risk of cervical cancer. Testing for HPV may also be done on women of any age with unclear Pap test results.  Other health care providers may not recommend any screening for nonpregnant women who are considered low risk for pelvic cancer and who do not have symptoms. Ask your health care provider if a screening pelvic exam is right for you.  If you have had past treatment for cervical cancer or a condition that could lead to cancer, you need Pap tests and screening for cancer for at least 20 years after your treatment. If Pap tests have been discontinued, your risk factors (such as having a new sexual partner) need to be reassessed to determine if screening should resume. Some women have medical problems that increase the chance of getting cervical cancer. In these cases, your health care provider may recommend more frequent screening and Pap tests. Colorectal Cancer  This type of cancer can be detected and often prevented.  Routine colorectal cancer screening usually begins at 33 years of age and continues through 33 years of age.  Your health care provider may recommend screening at an earlier age if you have risk factors for colon cancer.  Your health care provider may also recommend using home test kits to check for hidden blood in the stool.  A small camera at the end of a tube can be used to examine your colon directly (sigmoidoscopy or colonoscopy). This is done to check for the earliest forms of colorectal  cancer.  Routine screening usually begins at age 50.  Direct examination of the colon should be repeated every 5-10 years through 33 years of age. However, you may need to be screened more often if early forms of precancerous polyps or small growths are found. Skin Cancer  Check your skin from head to toe regularly.  Tell your health care provider about any new moles or changes in moles, especially if there is a change in a mole's shape or color.  Also tell your health care provider if you have a mole that is larger than the size of a pencil eraser.  Always use sunscreen. Apply sunscreen liberally and repeatedly throughout the day.  Protect yourself by wearing long sleeves, pants, a wide-brimmed hat, and sunglasses whenever you are outside. HEART DISEASE, DIABETES, AND HIGH BLOOD PRESSURE   High blood pressure causes heart disease and increases the risk of stroke. High blood pressure is more likely to develop in:  People who have blood pressure in the high end   of the normal range (130-139/85-89 mm Hg).  People who are overweight or obese.  People who are African American.  If you are 38-23 years of age, have your blood pressure checked every 3-5 years. If you are 61 years of age or older, have your blood pressure checked every year. You should have your blood pressure measured twice--once when you are at a hospital or clinic, and once when you are not at a hospital or clinic. Record the average of the two measurements. To check your blood pressure when you are not at a hospital or clinic, you can use:  An automated blood pressure machine at a pharmacy.  A home blood pressure monitor.  If you are between 45 years and 39 years old, ask your health care provider if you should take aspirin to prevent strokes.  Have regular diabetes screenings. This involves taking a blood sample to check your fasting blood sugar level.  If you are at a normal weight and have a low risk for diabetes,  have this test once every three years after 33 years of age.  If you are overweight and have a high risk for diabetes, consider being tested at a younger age or more often. PREVENTING INFECTION  Hepatitis B  If you have a higher risk for hepatitis B, you should be screened for this virus. You are considered at high risk for hepatitis B if:  You were born in a country where hepatitis B is common. Ask your health care provider which countries are considered high risk.  Your parents were born in a high-risk country, and you have not been immunized against hepatitis B (hepatitis B vaccine).  You have HIV or AIDS.  You use needles to inject street drugs.  You live with someone who has hepatitis B.  You have had sex with someone who has hepatitis B.  You get hemodialysis treatment.  You take certain medicines for conditions, including cancer, organ transplantation, and autoimmune conditions. Hepatitis C  Blood testing is recommended for:  Everyone born from [birth year] through 1964.  Anyone with known risk factors for hepatitis C. Sexually transmitted infections (STIs)  You should be screened for sexually transmitted infections (STIs) including gonorrhea and chlamydia if:  You are sexually active and are younger than 33 years of age.  You are older than 33 years of age and your health care provider tells you that you are at risk for this type of infection.  Your sexual activity has changed since you were last screened and you are at an increased risk for chlamydia or gonorrhea. Ask your health care provider if you are at risk.  If you do not have HIV, but are at risk, it may be recommended that you take a prescription medicine daily to prevent HIV infection. This is called pre-exposure prophylaxis (PrEP). You are considered at risk if:  You are sexually active and do not regularly use condoms or know the HIV status of your partner(s).  You take drugs by injection.  You are sexually  active with a partner who has HIV. Talk with your health care provider about whether you are at high risk of being infected with HIV. If you choose to begin PrEP, you should first be tested for HIV. You should then be tested every 3 months for as long as you are taking PrEP.  PREGNANCY   If you are premenopausal and you may become pregnant, ask your health care provider about preconception counseling.  If you may  become pregnant, take 400 to 800 micrograms (mcg) of folic acid every day.  If you want to prevent pregnancy, talk to your health care provider about birth control (contraception). OSTEOPOROSIS AND MENOPAUSE   Osteoporosis is a disease in which the bones lose minerals and strength with aging. This can result in serious bone fractures. Your risk for osteoporosis can be identified using a bone density scan.  If you are 49 years of age or older, or if you are at risk for osteoporosis and fractures, ask your health care provider if you should be screened.  Ask your health care provider whether you should take a calcium or vitamin D supplement to lower your risk for osteoporosis.  Menopause may have certain physical symptoms and risks.  Hormone replacement therapy may reduce some of these symptoms and risks. Talk to your health care provider about whether hormone replacement therapy is right for you.  HOME CARE INSTRUCTIONS   Schedule regular health, dental, and eye exams.  Stay current with your immunizations.   Do not use any tobacco products including cigarettes, chewing tobacco, or electronic cigarettes.  If you are pregnant, do not drink alcohol.  If you are breastfeeding, limit how much and how often you drink alcohol.  Limit alcohol intake to no more than 1 drink per day for nonpregnant women. One drink equals 12 ounces of beer, 5 ounces of wine, or 1 ounces of hard liquor.  Do not use street drugs.  Do not share needles.  Ask your health care provider for help if  you need support or information about quitting drugs.  Tell your health care provider if you often feel depressed.  Tell your health care provider if you have ever been abused or do not feel safe at home.   This information is not intended to replace advice given to you by your health care provider. Make sure you discuss any questions you have with your health care provider.   Document Released: 05/06/2011 Document Revised: 11/11/2014 Document Reviewed: 09/22/2013 Elsevier Interactive Patient Education 2016 [employer]. Medroxyprogesterone injection [Contraceptive] What is this medicine? MEDROXYPROGESTERONE (me DROX ee proe JES te rone) contraceptive injections prevent pregnancy. They provide effective birth control for 3 months. Depo-subQ Provera 104 is also used for treating pain related to endometriosis. This medicine may be used for other purposes; ask your health care provider or pharmacist if you have questions. What should I tell my health care provider before I take this medicine? They need to know if you have any of these conditions: -frequently drink alcohol -asthma -blood vessel disease or a history of a blood clot in the lungs or legs -bone disease such as osteoporosis -breast cancer -diabetes -eating disorder (anorexia nervosa or bulimia) -high blood pressure -HIV infection or AIDS -kidney disease -liver disease -mental depression -migraine -seizures (convulsions) -stroke -tobacco smoker -vaginal bleeding -an unusual or allergic reaction to medroxyprogesterone, other hormones, medicines, foods, dyes, or preservatives -pregnant or trying to get pregnant -breast-feeding How should I use this medicine? Depo-Provera Contraceptive injection is given into a muscle. Depo-subQ Provera 104 injection is given under the skin. These injections are given by a health care professional. You must not be pregnant before getting an injection. The injection is usually given during  the first 5 days after the start of a menstrual period or 6 weeks after delivery of a baby. Talk to your pediatrician regarding the use of this medicine in children. Special care may be needed. These injections have been used in  female children who have started having menstrual periods. Overdosage: If you think you have taken too much of this medicine contact a poison control center or emergency room at once. NOTE: This medicine is only for you. Do not share this medicine with others. What if I miss a dose? Try not to miss a dose. You must get an injection once every 3 months to maintain birth control. If you cannot keep an appointment, call and reschedule it. If you wait longer than 13 weeks between Depo-Provera contraceptive injections or longer than 14 weeks between Depo-subQ Provera 104 injections, you could get pregnant. Use another method for birth control if you miss your appointment. You may also need a pregnancy test before receiving another injection. What may interact with this medicine? Do not take this medicine with any of the following medications: -bosentan This medicine may also interact with the following medications: -aminoglutethimide -antibiotics or medicines for infections, especially rifampin, rifabutin, rifapentine, and griseofulvin -aprepitant -barbiturate medicines such as phenobarbital or primidone -bexarotene -carbamazepine -medicines for seizures like ethotoin, felbamate, oxcarbazepine, phenytoin, topiramate -modafinil -St. John's wort This list may not describe all possible interactions. Give your health care provider a list of all the medicines, herbs, non-prescription drugs, or dietary supplements you use. Also tell them if you smoke, drink alcohol, or use illegal drugs. Some items may interact with your medicine. What should I watch for while using this medicine? This drug does not protect you against HIV infection (AIDS) or other sexually transmitted  diseases. Use of this product may cause you to lose calcium from your bones. Loss of calcium may cause weak bones (osteoporosis). Only use this product for more than 2 years if other forms of birth control are not right for you. The longer you use this product for birth control the more likely you will be at risk for weak bones. Ask your health care professional how you can keep strong bones. You may have a change in bleeding pattern or irregular periods. Many females stop having periods while taking this drug. If you have received your injections on time, your chance of being pregnant is very low. If you think you may be pregnant, see your health care professional as soon as possible. Tell your health care professional if you want to get pregnant within the next year. The effect of this medicine may last a long time after you get your last injection. What side effects may I notice from receiving this medicine? Side effects that you should report to your doctor or health care professional as soon as possible: -allergic reactions like skin rash, itching or hives, swelling of the face, lips, or tongue -breast tenderness or discharge -breathing problems -changes in vision -depression -feeling faint or lightheaded, falls -fever -pain in the abdomen, chest, groin, or leg -problems with balance, talking, walking -unusually weak or tired -yellowing of the eyes or skin Side effects that usually do not require medical attention (report to your doctor or health care professional if they continue or are bothersome): -acne -fluid retention and swelling -headache -irregular periods, spotting, or absent periods -temporary pain, itching, or skin reaction at site where injected -weight gain This list may not describe all possible side effects. Call your doctor for medical advice about side effects. You may report side effects to FDA at 1-800-FDA-1088. Where should I keep my medicine? This does not apply.  The injection will be given to you by a health care professional. NOTE: This sheet is a summary. It may  not cover all possible information. If you have questions about this medicine, talk to your doctor, pharmacist, or health care provider.    2016, Elsevier/[employer]. (2008-11-11 18:37:56)

## 2015-08-11 NOTE — Progress Notes (Signed)
Patient ID: Hannah Costa, female   DOB: 1981/10/02, 34 y.o.   MRN: [id number] Hannah Costa [birth year] [id number]    History:    Presents for annual exam, last seen 2013.  Has noticed some cloudy discharge and odor the last 4 days and has started her period today.  Regular monthly cycles, using condoms, same partner/years requests STD screen.  Cramping on day one that is relieved with ibuprofen.  Normal pap history.  2 gardisil in 2009.  Interested in restarting depo-provera. Does not desire second child.  Past medical history, past surgical history, family history and social history were all reviewed and documented in the EPIC chart.  Working in Clinical biochemist for [employer].  Light tobacco use.  Mother died from kidney and heart failure, Father alive and well.  Brother hit and killed by a car in January.  27 year old daughter doing well in 4th grade, having some trouble coping with uncle's death.    ROS:  A ROS was performed and pertinent positives and negatives are included.  Exam:  Filed Vitals:   08/11/15 1536  BP: 110/72    General appearance:  Normal Thyroid:  Symmetrical, normal in size, without palpable masses or nodularity. Respiratory  Auscultation:  Clear without wheezing or rhonchi Cardiovascular  Auscultation:  Regular rate, without rubs, murmurs or gallops  Edema/varicosities:  Not grossly evident Abdominal  Soft,nontender, without masses, guarding or rebound.  Liver/spleen:  No organomegaly noted  Hernia:  None appreciated  Skin  Inspection:  Grossly normal   Breasts: Examined lying and sitting.     Right: Without masses, retractions, discharge or axillary adenopathy.     Left: Without masses, retractions, discharge or axillary adenopathy. Gentitourinary   Inguinal/mons:  Normal without inguinal adenopathy  External genitalia:  Normal  BUS/Urethra/Skene's glands:  Normal  Vagina:  Normal, menses Wet Prep: Negative  Cervix:  Normal  Uterus:  Normal in  size, shape and contour.  Midline and mobile  Adnexa/parametria:     Rt: Without masses or tenderness.   Lt: Without masses or tenderness.  Anus and perineum: Normal   Assessment/Plan:  34 y.o.  SBF G3P63for annual exam.   Normal GYN exam STI Screen Contraception  Plan:  Prescribed Depo-Provera 150 mg IM injection, first injection given today, return to office in 12 weeks, reviewed may have spotting.  Discussed need for continued condom use for the next month for pregnancy prevention and thereafter until permanent partner.  Pap, with HR HPV, GC/Chlamydia, RPR, HIV, Hep C and B Pending.  Discussed not douching or using perfumed genital products.  Discussed smoking cessation, SBE's, exercise, healthy diet, daily MVI. Wet prep negative, reviewed Discharge likely physiologic, will monitor symptoms at home and call if symptoms persist or worsen.      [facility], 4:19 PM 08/11/2015

## 2015-08-12 LAB — HEPATITIS C ANTIBODY: HCV AB: NEGATIVE

## 2015-08-12 LAB — HEPATITIS B SURFACE ANTIGEN: Hepatitis B Surface Ag: NEGATIVE

## 2015-08-12 LAB — GC/CHLAMYDIA PROBE AMP
CT PROBE, AMP APTIMA: NEGATIVE
GC PROBE AMP APTIMA: NEGATIVE

## 2015-08-12 LAB — RPR

## 2015-08-12 LAB — HIV ANTIBODY (ROUTINE TESTING W REFLEX): HIV: NONREACTIVE

## 2015-08-15 LAB — CYTOLOGY - PAP

## 2015-11-10 ENCOUNTER — Ambulatory Visit (INDEPENDENT_AMBULATORY_CARE_PROVIDER_SITE_OTHER): Payer: 59 | Admitting: Gynecology

## 2015-11-10 DIAGNOSIS — Z3042 Encounter for surveillance of injectable contraceptive: Secondary | ICD-10-CM

## 2015-11-10 MED ORDER — MEDROXYPROGESTERONE ACETATE 150 MG/ML IM SUSP
150.0000 mg | Freq: Once | INTRAMUSCULAR | Status: AC
  Administered 2015-11-10: 150 mg via INTRAMUSCULAR

## 2016-08-01 ENCOUNTER — Ambulatory Visit (INDEPENDENT_AMBULATORY_CARE_PROVIDER_SITE_OTHER): Payer: 59 | Admitting: Urgent Care

## 2016-08-01 VITALS — BP 98/68 | HR 91 | Temp 98.2°F | Ht 67.1 in | Wt 173.0 lb

## 2016-08-01 DIAGNOSIS — Z0000 Encounter for general adult medical examination without abnormal findings: Secondary | ICD-10-CM | POA: Diagnosis not present

## 2016-08-01 DIAGNOSIS — Z8249 Family history of ischemic heart disease and other diseases of the circulatory system: Secondary | ICD-10-CM | POA: Diagnosis not present

## 2016-08-01 DIAGNOSIS — Z23 Encounter for immunization: Secondary | ICD-10-CM | POA: Diagnosis not present

## 2016-08-01 LAB — COMPREHENSIVE METABOLIC PANEL
ALBUMIN: 4 g/dL (ref 3.6–5.1)
ALK PHOS: 62 U/L (ref 33–115)
ALT: 8 U/L (ref 6–29)
AST: 13 U/L (ref 10–30)
BILIRUBIN TOTAL: 0.4 mg/dL (ref 0.2–1.2)
BUN: 8 mg/dL (ref 7–25)
CALCIUM: 9.4 mg/dL (ref 8.6–10.2)
CO2: 22 mmol/L (ref 20–31)
Chloride: 109 mmol/L (ref 98–110)
Creat: 0.69 mg/dL (ref 0.50–1.10)
GLUCOSE: 89 mg/dL (ref 65–99)
Potassium: 4.3 mmol/L (ref 3.5–5.3)
Sodium: 139 mmol/L (ref 135–146)
TOTAL PROTEIN: 7.5 g/dL (ref 6.1–8.1)

## 2016-08-01 LAB — LIPID PANEL
CHOLESTEROL: 150 mg/dL (ref 125–200)
HDL: 43 mg/dL — AB (ref >=46)
LDL Cholesterol: 95 mg/dL (ref <130)
Total CHOL/HDL Ratio: 3.5 Ratio (ref <=5.0)
Triglycerides: 61 mg/dL (ref <150)
VLDL: 12 mg/dL (ref <30)

## 2016-08-01 LAB — CBC
HEMATOCRIT: 39.9 % (ref 35.0–45.0)
HEMOGLOBIN: 13.7 g/dL (ref 11.7–15.5)
MCH: 28.7 pg (ref 27.0–33.0)
MCHC: 34.3 g/dL (ref 32.0–36.0)
MCV: 83.6 fL (ref 80.0–100.0)
MPV: 10.6 fL (ref 7.5–12.5)
Platelets: 226 10*3/uL (ref 140–400)
RBC: 4.77 MIL/uL (ref 3.80–5.10)
RDW: 14 % (ref 11.0–15.0)
WBC: 6.6 10*3/uL (ref 3.8–10.8)

## 2016-08-01 LAB — TSH: TSH: 1.26 m[IU]/L

## 2016-08-01 NOTE — Progress Notes (Addendum)
MRN: [id number]  Subjective:   Hannah Costa is a 35 y.o. female presenting for annual physical exam.  Medical care team includes: PCP: No primary care provider on file. Vision: No visual deficits. Dental: Cleanings every 6 months. OB/GYN: History of C-section, abnormal pap smears. Last pap smear was 08/2015, is on 1 year pap smear repeats. Specialists: None.  Patient is currently single, has one 66 year old daughter, lives on her own with daughter. Has good relationships and support network at home. 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 Denies smoking cigarettes or drinking alcohol. Has a 3 pack year smoking history, quit in 07/2015.    Hannah Costa does not have any active problems on his problem list.   Hannah Costa is not currently taking any medications. She has No Known Allergies.  Hannah Costa  has a past medical history of Smoker. Also  has a past surgical history that includes Cesarean section.  Her family history includes Diabetes in her maternal grandmother; Hypertension in her maternal grandfather and maternal grandmother.  Immunizations: Tdap will be updated today. Patient refused influenza vaccine today, plans on getting this with her daughter.  Review of Systems  Constitutional: Negative for chills, diaphoresis, fever, malaise/fatigue and weight loss.  HENT: Negative for congestion, ear discharge, ear pain, hearing loss, nosebleeds, sore throat and tinnitus.   Eyes: Negative for blurred vision, double vision, photophobia, pain, discharge and redness.  Respiratory: Negative for cough, shortness of breath and wheezing.   Cardiovascular: Negative for chest pain, palpitations and leg swelling.  Gastrointestinal: Negative for abdominal pain, blood in stool, constipation, diarrhea, nausea and vomiting.  Genitourinary: Negative for dysuria, flank pain, frequency, hematuria and urgency.  Musculoskeletal: Negative for back pain, joint pain and myalgias.  Skin:  Negative for itching and rash.  Neurological: Negative for dizziness, tingling, seizures, loss of consciousness, weakness and headaches.  Endo/Heme/Allergies: Negative for polydipsia.  Psychiatric/Behavioral: Negative for depression, hallucinations, memory loss, substance abuse and suicidal ideas. The patient is not nervous/anxious and does not have insomnia.    Objective:   Vitals: BP 98/68 (BP Location: Right Arm, Patient Position: Sitting, Cuff Size: Large)   Pulse 91   Temp 98.2 F (36.8 C) (Oral)   Ht 5' 7.1" (1.704 m)   Wt 173 lb (78.5 kg)   SpO2 98%   BMI 27.01 kg/m    Visual Acuity Screening   Right eye Left eye Both eyes  Without correction:     With correction: 20/25 20/20 20/20     Physical Exam  Constitutional: She is oriented to person, place, and time. She appears well-developed and well-nourished.  HENT:  TM's intact bilaterally, no effusions or erythema. Nasal turbinates pink and moist, nasal passages patent. No sinus tenderness. Oropharynx clear, mucous membranes moist, dentition in good repair.  Eyes: Conjunctivae and EOM are normal. Pupils are equal, round, and reactive to light. Right eye exhibits no discharge. Left eye exhibits no discharge. No scleral icterus.  Neck: Normal range of motion. Neck supple. No thyromegaly present.  Cardiovascular: Normal rate, regular rhythm and intact distal pulses.  Exam reveals no gallop and no friction rub.   No murmur heard. Pulmonary/Chest: No respiratory distress. She has no wheezes. She has no rales.  Abdominal: Soft. Bowel sounds are normal. She exhibits no distension and no mass. There is no tenderness.  Musculoskeletal: Normal range of motion. She exhibits no edema or tenderness.  Lymphadenopathy:    She has no cervical adenopathy.  Neurological: She is alert  and oriented to person, place, and time. She has normal reflexes.  Skin: Skin is warm and dry. No rash noted. No erythema. No pallor.  Psychiatric: She has a  normal mood and affect.   Assessment and Plan :   1. Annual physical exam - Patient is medically healthy, I congratulated her on her efforts to be healthy including smoking cessation. Labs are pending. I will complete paper work as appropriate. - Discussed healthy lifestyle, diet, exercise, preventative care, vaccinations, and addressed patient's concerns.   2. Need for prophylactic vaccination with combined diphtheria-tetanus-pertussis (DTP) vaccine - Tdap vaccine greater than or equal to 7yo IM  3. Family history of heart disease - Monitor  Wallis BambergMario Mariapaula Krist, New JerseyPA-C [facility] [phone] 08/01/2016  8:42 AM

## 2016-08-01 NOTE — Patient Instructions (Addendum)
Keeping You Healthy  Get These Tests 1. Blood Pressure- Have your blood pressure checked once a year by your health care provider.  Normal blood pressure is 120/80. 2. Weight- Have your body mass index (BMI) calculated to screen for obesity.  BMI is measure of body fat based on height and weight.  You can also calculate your own BMI at www.nhlbisupport.com/bmi/. 3. Cholesterol- Have your cholesterol checked every 5 years starting at age 34 then yearly starting at age 45. 4. Chlamydia, HIV, and other sexually transmitted diseases- Get screened every year until age 25, then within three months of each new sexual provider. 5. Pap Test - Every 1-5 years; discuss with your health care provider. 6. Mammogram- Every 1-2 years starting at age 40--50  Take these medicines  Calcium with Vitamin D-Your body needs 1200 mg of Calcium each day and 800-1000 IU of Vitamin D daily.  Your body can only absorb 500 mg of Calcium at a time so Calcium must be taken in 2 or 3 divided doses throughout the day.  Multivitamin with folic acid- Once daily if it is possible for you to become pregnant.  Get these Immunizations  Gardasil-Series of three doses; prevents HPV related illness such as genital warts and cervical cancer.  Menactra-Single dose; prevents meningitis.  Tetanus shot- Every 10 years.  Flu shot-Every year.  Take these steps 1. Do not smoke-Your healthcare provider can help you quit.  For tips on how to quit go to www.smokefree.gov or call 1-800 QUITNOW. 2. Be physically active- Exercise 5 days a week for at least 30 minutes.  If you are not already physically active, start slow and gradually work up to 30 minutes of moderate physical activity.  Examples of moderate activity include walking briskly, dancing, swimming, bicycling, etc. 3. Breast Cancer- A self breast exam every month is important for early detection of breast cancer.  For more information and instruction on self breast exams, ask your  healthcare provider or www.womenshealth.gov/faq/breast-self-exam.cfm. 4. Eat a healthy diet- Eat a variety of healthy foods such as fruits, vegetables, whole grains, low fat milk, low fat cheeses, yogurt, lean meats, poultry and fish, beans, nuts, tofu, etc.  For more information go to www. Thenutritionsource.org 5. Drink alcohol in moderation- Limit alcohol intake to one drink or less per day. Never drink and drive. 6. Depression- Your emotional health is as important as your physical health.  If you're feeling down or losing interest in things you normally enjoy please talk to your healthcare provider about being screened for depression. 7. Dental visit- Brush and floss your teeth twice daily; visit your dentist twice a year. 8. Eye doctor- Get an eye exam at least every 2 years. 9. Helmet use- Always wear a helmet when riding a bicycle, motorcycle, rollerblading or skateboarding. 10. Safe sex- If you may be exposed to sexually transmitted infections, use a condom. 11. Seat belts- Seat belts can save your live; always wear one. 12. Smoke/Carbon Monoxide detectors- These detectors need to be installed on the appropriate level of your home. Replace batteries at least once a year. 13. Skin cancer- When out in the sun please cover up and use sunscreen 15 SPF or higher. 14. Violence- If anyone is threatening or hurting you, please tell your healthcare provider.           IF you received an x-ray today, you will receive an invoice from [facility]. Please contact [facility] at [phone] with questions or concerns regarding your invoice.     IF you received labwork today, you will receive an invoice from [facility]. Please contact [facility] at [phone] with questions or concerns regarding your invoice.   Our billing staff will not be able to assist you with questions regarding bills from these companies.  You will be contacted with the lab  results as soon as they are available. The fastest way to get your results is to activate your My Chart account. Instructions are located on the last page of this paperwork. If you have not heard from us regarding the results in 2 weeks, please contact this office.     

## 2016-08-02 ENCOUNTER — Encounter: Payer: Self-pay | Admitting: Urgent Care

## 2016-08-03 ENCOUNTER — Telehealth: Payer: Self-pay

## 2016-08-03 NOTE — Telephone Encounter (Signed)
Spoke with pt.   Form at front desk for pt to sign,  We will give copy,  Then fax form to number highlighted on form Then place form in box for scan.  Mani completed form.

## 2016-09-21 ENCOUNTER — Ambulatory Visit (INDEPENDENT_AMBULATORY_CARE_PROVIDER_SITE_OTHER): Payer: 59 | Admitting: Physician Assistant

## 2016-09-21 VITALS — BP 120/70 | HR 86 | Temp 98.3°F | Resp 16 | Ht 67.5 in | Wt 177.0 lb

## 2016-09-21 DIAGNOSIS — H00012 Hordeolum externum right lower eyelid: Secondary | ICD-10-CM

## 2016-09-21 MED ORDER — POLYMYXIN B-TRIMETHOPRIM 10000-0.1 UNIT/ML-% OP SOLN: 1.0000 [drp] | OPHTHALMIC | 0 refills | Status: DC

## 2016-09-21 NOTE — Patient Instructions (Addendum)
The ibuprofen 600 mg three times daily with food. Continue your home care.  If you're not seeing significant improvement in 48 hours, start the antibiotic drops. If you don't see significant improvement after 2 days on the drops, contact your eye specialist.    IF you received an x-ray today, you will [facility]. Please contact [facility] at [phone] with questions or concerns regarding your invoice.   IF you received labwork today, you will receive an invoice from [facility]. Please contact [facility] at [phone] with questions or concerns regarding your invoice.   Our billing staff will not be able to assist you with questions regarding bills from these companies.  You will be contacted with the lab results as soon as they are available. The fastest way to get your results is to activate your My Chart account. Instructions are located on the last page of this paperwork. If you have not heard from us regarding the results in 2 weeks, please contact this office.    Stye A stye is a bump on your eyelid caused by a bacterial infection. A stye can form inside the eyelid (internal stye) or outside the eyelid (external stye). An internal stye may be caused by an infected oil-producing gland inside your eyelid. An external stye may be caused by an infection at the base of your eyelash (hair follicle). Styes are very common. Anyone can get them at any age. They usually occur in just one eye, but you may have more than one in either eye. What are the causes? The infection is almost always caused by bacteria called Staphylococcus aureus. This is a common type of bacteria that lives on your skin. What increases the risk? You may be at higher risk for a stye if you have had one before. You may also be at higher risk if you have:  Diabetes.  Long-term illness.  Long-term eye redness.  A skin condition called  seborrhea.  High fat levels in your blood (lipids). What are the signs or symptoms? Eyelid pain is the most common symptom of a stye. Internal styes are more painful than external styes. Other signs and symptoms may include:  Painful swelling of your eyelid.  A scratchy feeling in your eye.  Tearing and redness of your eye.  Pus draining from the stye. How is this diagnosed? Your health care provider may be able to diagnose a stye just by examining your eye. The health care provider may also check to make sure:  You do not have a fever or other signs of a more serious infection.  The infection has not spread to other parts of your eye or areas around your eye. How is this treated? Most styes will clear up in a few days without treatment. In some cases, you may need to use antibiotic drops or ointment to prevent infection. Your health care provider may have to drain the stye surgically if your stye is:  Large.  Causing a lot of pain.  Interfering with your vision. This can be done using a thin blade or a needle. Follow these instructions at home:  Take medicines only as directed by your health care provider.  Apply a clean, warm compress to your eye for 10 minutes, 4 times a day.  Do not wear contact lenses or eye makeup until your stye has healed.  Do not try to pop or drain the stye. Contact a health care provider if:  You have  chills or a fever.  Your stye does not go away after several days.  Your stye affects your vision.  Your eyeball becomes swollen, red, or painful. This information is not intended to replace advice given to you by your health care provider. Make sure you discuss any questions you have with your health care provider. Document Released: 07/31/2005 Document Revised: 06/16/2016 Document Reviewed: 02/04/2014 Elsevier Interactive Patient Education  2017 [employer]

## 2016-09-21 NOTE — Progress Notes (Signed)
   Patient ID: Senaida LangeKena Lauricella, female    DOB: 11/02/1981, 35 y.o.   MRN: [id number]  PCP: No PCP Per Patient  Chief Complaint  Patient presents with  . Belepharitis    Right eye, x 4 days    Subjective:   Presents for evaluation of a stye.  "I'm assuming this is a stye." Having this issue since 2009, usually once a year. This one started 4 days ago, after having one in 06/2016. Describes a chalazion in this location chronically. Her typical home treatments are not helping, the area is more painful and more swollen than usual. "I see a pimple on my eye lid."  No globe pain/tenderness. No visual changes. No fever, chills.   Review of Systems As above.    There are no active problems to display for this patient.    Prior to Admission medications   Not on File     No Known Allergies     Objective:  Physical Exam  Constitutional: She is oriented to person, place, and time. She appears well-developed and well-nourished. She is active and cooperative. No distress.  BP 120/70   Pulse 86   Temp 98.3 F (36.8 C) (Oral)   Resp 16   Ht 5' 7.5" (1.715 m)   Wt 177 lb (80.3 kg)   SpO2 97%   BMI 27.31 kg/m    Eyes: Conjunctivae and EOM are normal. Right eye exhibits hordeolum. Right eye exhibits no chemosis, no discharge and no exudate. No foreign body present in the right eye. Left eye exhibits no chemosis, no discharge, no exudate and no hordeolum. No foreign body present in the left eye. No scleral icterus.    There is a pustule along the lashes. With gentle pressure, whitish material and then clear liquid is expressed. Minimal tenderness.   Pulmonary/Chest: Effort normal.  Neurological: She is alert and oriented to person, place, and time.  Psychiatric: She has a normal mood and affect. Her speech is normal and behavior is normal.           Assessment & Plan:   1. Hordeolum externum of right lower eyelid Continue her home treatment. Add OTC ibuprofen. If no  considerable improvement in 48 hours, start antibiotic eye drops. If still not improving, see her eye specialist for possible drainage vs excision. - trimethoprim-polymyxin b (POLYTRIM) ophthalmic solution; Place 1 drop into the right eye every 4 (four) hours.  Dispense: 10 mL; Refill: 0   Fernande Brashelle S. Pericles Carmicheal, PA-C Physician Assistant-Certified [facility]

## 2017-03-19 ENCOUNTER — Encounter: Payer: Self-pay | Admitting: Gynecology

## 2020-05-23 ENCOUNTER — Other Ambulatory Visit: Payer: Self-pay

## 2020-05-23 ENCOUNTER — Encounter: Payer: Self-pay | Admitting: Nurse Practitioner

## 2020-05-23 ENCOUNTER — Ambulatory Visit (INDEPENDENT_AMBULATORY_CARE_PROVIDER_SITE_OTHER): Payer: No Typology Code available for payment source | Admitting: Nurse Practitioner

## 2020-05-23 VITALS — BP 118/78 | Ht 68.0 in | Wt 173.0 lb

## 2020-05-23 DIAGNOSIS — Z01419 Encounter for gynecological examination (general) (routine) without abnormal findings: Secondary | ICD-10-CM | POA: Diagnosis not present

## 2020-05-23 DIAGNOSIS — Z113 Encounter for screening for infections with a predominantly sexual mode of transmission: Secondary | ICD-10-CM | POA: Diagnosis not present

## 2020-05-23 DIAGNOSIS — Z13220 Encounter for screening for lipoid disorders: Secondary | ICD-10-CM

## 2020-05-23 NOTE — Patient Instructions (Signed)
Health Maintenance, Female Adopting a healthy lifestyle and getting preventive care are important in promoting health and wellness. Ask your health care provider about:  The right schedule for you to have regular tests and exams.  Things you can do on your own to prevent diseases and keep yourself healthy. What should I know about diet, weight, and exercise? Eat a healthy diet   Eat a diet that includes plenty of vegetables, fruits, low-fat dairy products, and lean protein.  Do not eat a lot of foods that are high in solid fats, added sugars, or sodium. Maintain a healthy weight Body mass index (BMI) is used to identify weight problems. It estimates body fat based on height and weight. Your health care provider can help determine your BMI and help you achieve or maintain a healthy weight. Get regular exercise Get regular exercise. This is one of the most important things you can do for your health. Most adults should:  Exercise for at least 150 minutes each week. The exercise should increase your heart rate and make you sweat (moderate-intensity exercise).  Do strengthening exercises at least twice a week. This is in addition to the moderate-intensity exercise.  Spend less time sitting. Even light physical activity can be beneficial. Watch cholesterol and blood lipids Have your blood tested for lipids and cholesterol at 38 years of age, then have this test every 5 years. Have your cholesterol levels checked more often if:  Your lipid or cholesterol levels are high.  You are older than 38 years of age.  You are at high risk for heart disease. What should I know about cancer screening? Depending on your health history and family history, you may need to have cancer screening at various ages. This may include screening for:  Breast cancer.  Cervical cancer.  Colorectal cancer.  Skin cancer.  Lung cancer. What should I know about heart disease, diabetes, and high blood  pressure? Blood pressure and heart disease  High blood pressure causes heart disease and increases the risk of stroke. This is more likely to develop in people who have high blood pressure readings, are of African descent, or are overweight.  Have your blood pressure checked: ? Every 3-5 years if you are 18-39 years of age. ? Every year if you are 40 years old or older. Diabetes Have regular diabetes screenings. This checks your fasting blood sugar level. Have the screening done:  Once every three years after age 40 if you are at a normal weight and have a low risk for diabetes.  More often and at a younger age if you are overweight or have a high risk for diabetes. What should I know about preventing infection? Hepatitis B If you have a higher risk for hepatitis B, you should be screened for this virus. Talk with your health care provider to find out if you are at risk for hepatitis B infection. Hepatitis C Testing is recommended for:  Everyone born from 1945 through 1965.  Anyone with known risk factors for hepatitis C. Sexually transmitted infections (STIs)  Get screened for STIs, including gonorrhea and chlamydia, if: ? You are sexually active and are younger than 38 years of age. ? You are older than 38 years of age and your health care provider tells you that you are at risk for this type of infection. ? Your sexual activity has changed since you were last screened, and you are at increased risk for chlamydia or gonorrhea. Ask your health care provider if   you are at risk.  Ask your health care provider about whether you are at high risk for HIV. Your health care provider may recommend a prescription medicine to help prevent HIV infection. If you choose to take medicine to prevent HIV, you should first get tested for HIV. You should then be tested every 3 months for as long as you are taking the medicine. Pregnancy  If you are about to stop having your period (premenopausal) and  you may become pregnant, seek counseling before you get pregnant.  Take 400 to 800 micrograms (mcg) of folic acid every day if you become pregnant.  Ask for birth control (contraception) if you want to prevent pregnancy. Osteoporosis and menopause Osteoporosis is a disease in which the bones lose minerals and strength with aging. This can result in bone fractures. If you are 65 years old or older, or if you are at risk for osteoporosis and fractures, ask your health care provider if you should:  Be screened for bone loss.  Take a calcium or vitamin D supplement to lower your risk of fractures.  Be given hormone replacement therapy (HRT) to treat symptoms of menopause. Follow these instructions at home: Lifestyle  Do not use any products that contain nicotine or tobacco, such as cigarettes, e-cigarettes, and chewing tobacco. If you need help quitting, ask your health care provider.  Do not use street drugs.  Do not share needles.  Ask your health care provider for help if you need support or information about quitting drugs. Alcohol use  Do not drink alcohol if: ? Your health care provider tells you not to drink. ? You are pregnant, may be pregnant, or are planning to become pregnant.  If you drink alcohol: ? Limit how much you use to 0-1 drink a day. ? Limit intake if you are breastfeeding.  Be aware of how much alcohol is in your drink. In the [place] one drink equals one 12 oz bottle of beer (355 mL), one 5 oz glass of wine (148 mL), or one 1 oz glass of hard liquor (44 mL). General instructions  Schedule regular health, dental, and eye exams.  Stay current with your vaccines.  Tell your health care provider if: ? You often feel depressed. ? You have ever been abused or do not feel safe at home. Summary  Adopting a healthy lifestyle and getting preventive care are important in promoting health and wellness.  Follow your health care provider's instructions about healthy  diet, exercising, and getting tested or screened for diseases.  Follow your health care provider's instructions on monitoring your cholesterol and blood pressure. This information is not intended to replace advice given to you by your health care provider. Make sure you discuss any questions you have with your health care provider. Document Revised: 10/14/2018 Document Reviewed: 10/14/2018 Elsevier Patient Education  2020 [employer] Inc[employer]  

## 2020-05-23 NOTE — Progress Notes (Signed)
   Hannah Costa 1980/11/19 [id number]   History:  39 y.o. G3P1021 presents to reestablish care without GYN complaints. Regular monthly cycle. Sexually active, uses pullout method. Miscarriage 10/2019. Was on depo and mini pill in the past but did not tolerate. Smoker. Would be OK with pregnancy.   Gynecologic History Patient's last menstrual period was 05/11/2020. Period Cycle (Days): 25 Period Duration (Days): 6 Period Pattern: Regular Menstrual Flow: Moderate Dysmenorrhea: (!) Severe Dysmenorrhea Symptoms: Cramping Contraception: none Last Pap: 08/11/2015. Results were: normal  Past medical history, past surgical history, family history and social history were all reviewed and documented in the EPIC chart.  ROS:  A ROS was performed and pertinent positives and negatives are included.  Exam:  Vitals:   05/23/20 0828  BP: 118/78  Weight: 173 lb (78.5 kg)  Height: 5\' 8"  (1.727 m)   Body mass index is 26.3 kg/m.  General appearance:  Normal Thyroid:  Symmetrical, normal in size, without palpable masses or nodularity. Respiratory  Auscultation:  Clear without wheezing or rhonchi Cardiovascular  Auscultation:  Regular rate, without rubs, murmurs or gallops  Edema/varicosities:  Not grossly evident Abdominal  Soft,nontender, without masses, guarding or rebound.  Liver/spleen:  No organomegaly noted  Hernia:  None appreciated  Skin  Inspection:  Grossly normal   Breasts: Examined lying and sitting.   Right: Without masses, retractions, discharge or axillary adenopathy.   Left: Without masses, retractions, discharge or axillary adenopathy. Gentitourinary   Inguinal/mons:  Normal without inguinal adenopathy  External genitalia:  Normal  BUS/Urethra/Skene's glands:  Normal  Vagina:  Normal  Cervix:  Normal  Uterus:  Difficult to palpate due to body habitus   Adnexa/parametria:     Rt: Without masses or tenderness.   Lt: Without masses or tenderness.  Anus and  perineum: Normal  Assessment/Plan:  39 y.o. 20 to reestablish care.   Well female exam with routine gynecological exam - Plan: CBC with Differential/Platelet, Comprehensive metabolic panel, PAP, ThinPrep ASCUS Rflx HPV Rflx Type. Education provided on SBEs, importance of preventative screenings, current guidelines, high calcium diet, regular exercise, and multivitamin daily.   Lipid screening - Plan: Lipid panel  Screen for STD (sexually transmitted disease) - Plan: RPR, HIV Antibody (routine testing w rflx)  Follow up in 1 year for annual         [id number] [facility], 8:41 AM 05/23/2020

## 2020-05-24 LAB — COMPREHENSIVE METABOLIC PANEL
AG Ratio: 1.4 (calc) (ref 1.0–2.5)
ALT: 7 U/L (ref 6–29)
AST: 15 U/L (ref 10–30)
Albumin: 4.2 g/dL (ref 3.6–5.1)
Alkaline phosphatase (APISO): 59 U/L (ref 31–125)
BUN: 9 mg/dL (ref 7–25)
CO2: 24 mmol/L (ref 20–32)
Calcium: 9.4 mg/dL (ref 8.6–10.2)
Chloride: 104 mmol/L (ref 98–110)
Creat: 0.88 mg/dL (ref 0.50–1.10)
Globulin: 2.9 g/dL (calc) (ref 1.9–3.7)
Glucose, Bld: 85 mg/dL (ref 65–99)
Potassium: 4.2 mmol/L (ref 3.5–5.3)
Sodium: 137 mmol/L (ref 135–146)
Total Bilirubin: 0.6 mg/dL (ref 0.2–1.2)
Total Protein: 7.1 g/dL (ref 6.1–8.1)

## 2020-05-24 LAB — CBC WITH DIFFERENTIAL/PLATELET
Absolute Monocytes: 684 cells/uL (ref 200–950)
Basophils Absolute: 23 cells/uL (ref 0–200)
Basophils Relative: 0.3 %
Eosinophils Absolute: 129 cells/uL (ref 15–500)
Eosinophils Relative: 1.7 %
HCT: 40.8 % (ref 35.0–45.0)
Hemoglobin: 13.6 g/dL (ref 11.7–15.5)
Lymphs Abs: 2455 cells/uL (ref 850–3900)
MCH: 29.2 pg (ref 27.0–33.0)
MCHC: 33.3 g/dL (ref 32.0–36.0)
MCV: 87.7 fL (ref 80.0–100.0)
MPV: 12.6 fL — ABNORMAL HIGH (ref 7.5–12.5)
Monocytes Relative: 9 %
Neutro Abs: 4309 cells/uL (ref 1500–7800)
Neutrophils Relative %: 56.7 %
Platelets: 199 10*3/uL (ref 140–400)
RBC: 4.65 10*6/uL (ref 3.80–5.10)
RDW: 13.3 % (ref 11.0–15.0)
Total Lymphocyte: 32.3 %
WBC: 7.6 10*3/uL (ref 3.8–10.8)

## 2020-05-24 LAB — LIPID PANEL
Cholesterol: 122 mg/dL (ref <200)
HDL: 40 mg/dL — ABNORMAL LOW (ref >=50)
LDL Cholesterol (Calc): 67 mg/dL (calc)
Non-HDL Cholesterol (Calc): 82 mg/dL (calc) (ref <130)
Total CHOL/HDL Ratio: 3.1 (calc) (ref <5.0)
Triglycerides: 72 mg/dL (ref <150)

## 2020-05-24 LAB — RPR: RPR Ser Ql: NONREACTIVE

## 2020-05-24 LAB — HIV ANTIBODY (ROUTINE TESTING W REFLEX): HIV 1&2 Ab, 4th Generation: NONREACTIVE

## 2020-05-25 LAB — PAP THINPREP ASCUS RFLX HPV RFLX TYPE
C. trachomatis RNA, TMA: NOT DETECTED
N. gonorrhoeae RNA, TMA: NOT DETECTED

## 2021-03-26 ENCOUNTER — Other Ambulatory Visit: Payer: Self-pay

## 2021-03-26 ENCOUNTER — Ambulatory Visit (INDEPENDENT_AMBULATORY_CARE_PROVIDER_SITE_OTHER): Payer: No Typology Code available for payment source | Admitting: Nurse Practitioner

## 2021-03-26 ENCOUNTER — Encounter: Payer: Self-pay | Admitting: Nurse Practitioner

## 2021-03-26 VITALS — BP 118/76

## 2021-03-26 DIAGNOSIS — B9689 Other specified bacterial agents as the cause of diseases classified elsewhere: Secondary | ICD-10-CM

## 2021-03-26 DIAGNOSIS — R3915 Urgency of urination: Secondary | ICD-10-CM

## 2021-03-26 DIAGNOSIS — Z113 Encounter for screening for infections with a predominantly sexual mode of transmission: Secondary | ICD-10-CM

## 2021-03-26 DIAGNOSIS — N898 Other specified noninflammatory disorders of vagina: Secondary | ICD-10-CM | POA: Diagnosis not present

## 2021-03-26 DIAGNOSIS — N760 Acute vaginitis: Secondary | ICD-10-CM | POA: Diagnosis not present

## 2021-03-26 DIAGNOSIS — B373 Candidiasis of vulva and vagina: Secondary | ICD-10-CM

## 2021-03-26 DIAGNOSIS — B3731 Acute candidiasis of vulva and vagina: Secondary | ICD-10-CM

## 2021-03-26 LAB — WET PREP FOR TRICH, YEAST, CLUE

## 2021-03-26 MED ORDER — METRONIDAZOLE 500 MG PO TABS: 500.0000 mg | ORAL_TABLET | Freq: Two times a day (BID) | ORAL | 0 refills | Status: AC

## 2021-03-26 MED ORDER — FLUCONAZOLE 150 MG PO TABS: 150.0000 mg | ORAL_TABLET | Freq: Once | ORAL | 0 refills | Status: AC

## 2021-03-26 NOTE — Progress Notes (Signed)
   Acute Office Visit  Subjective:    Patient ID: Hannah Costa, female    DOB: 15-Jun-1981, 40 y.o.   MRN: [id number]   HPI 40 y.o. presents today for vaginal irritation and burning with urination. Sexually active 5/14 where condom broke.    Review of Systems  Constitutional: Negative.   Genitourinary: Positive for dysuria and vaginal pain. Negative for frequency, hematuria, urgency and vaginal discharge.       Objective:    Physical Exam Constitutional:      Appearance: Normal appearance.  Genitourinary:    General: Normal vulva.     Vagina: Vaginal discharge present. No erythema.     Cervix: Normal.     BP 118/76   LMP 03/11/2021  Wt Readings from Last 3 Encounters:  05/23/20 173 lb (78.5 kg)  09/21/16 177 lb (80.3 kg)  08/01/16 173 lb (78.5 kg)   UA negative Wet prep + clue cells     Assessment & Plan:   Problem List Items Addressed This Visit   None   Visit Diagnoses    Bacterial vaginosis    -  Primary   Relevant Medications   metroNIDAZOLE (FLAGYL) 500 MG tablet   fluconazole (DIFLUCAN) 150 MG tablet   Vaginal irritation       Relevant Orders   WET PREP FOR TRICH, YEAST, CLUE   Screen for STD (sexually transmitted disease)       Relevant Orders   C. trachomatis/N. gonorrhoeae RNA   HIV Antibody (routine testing w rflx)   RPR   Urinary urgency       Relevant Orders   Urinalysis,Complete w/RFL Culture   Vaginal candidiasis       Relevant Medications   metroNIDAZOLE (FLAGYL) 500 MG tablet   fluconazole (DIFLUCAN) 150 MG tablet     Plan: Wet prep positive for clue cells - Flagyl 500 mg BID x 7 days. UA negative. GC/Chlamydia pending. She requested Diflucan x 1 for yeast prevention. She will return if symptoms worsen or do not improve.      [name] DNP, 3:32 PM 03/26/2021

## 2021-03-26 NOTE — Patient Instructions (Signed)
Health Maintenance, Female Adopting a healthy lifestyle and getting preventive care are important in promoting health and wellness. Ask your health care provider about:  The right schedule for you to have regular tests and exams.  Things you can do on your own to prevent diseases and keep yourself healthy. What should I know about diet, weight, and exercise? Eat a healthy diet  Eat a diet that includes plenty of vegetables, fruits, low-fat dairy products, and lean protein.  Do not eat a lot of foods that are high in solid fats, added sugars, or sodium.   Maintain a healthy weight Body mass index (BMI) is used to identify weight problems. It estimates body fat based on height and weight. Your health care provider can help determine your BMI and help you achieve or maintain a healthy weight. Get regular exercise Get regular exercise. This is one of the most important things you can do for your health. Most adults should:  Exercise for at least 150 minutes each week. The exercise should increase your heart rate and make you sweat (moderate-intensity exercise).  Do strengthening exercises at least twice a week. This is in addition to the moderate-intensity exercise.  Spend less time sitting. Even light physical activity can be beneficial. Watch cholesterol and blood lipids Have your blood tested for lipids and cholesterol at 39 years of age, then have this test every 5 years. Have your cholesterol levels checked more often if:  Your lipid or cholesterol levels are high.  You are older than 40 years of age.  You are at high risk for heart disease. What should I know about cancer screening? Depending on your health history and family history, you may need to have cancer screening at various ages. This may include screening for:  Breast cancer.  Cervical cancer.  Colorectal cancer.  Skin cancer.  Lung cancer. What should I know about heart disease, diabetes, and high blood  pressure? Blood pressure and heart disease  High blood pressure causes heart disease and increases the risk of stroke. This is more likely to develop in people who have high blood pressure readings, are of African descent, or are overweight.  Have your blood pressure checked: ? Every 3-5 years if you are 18-39 years of age. ? Every year if you are 40 years old or older. Diabetes Have regular diabetes screenings. This checks your fasting blood sugar level. Have the screening done:  Once every three years after age 40 if you are at a normal weight and have a low risk for diabetes.  More often and at a younger age if you are overweight or have a high risk for diabetes. What should I know about preventing infection? Hepatitis B If you have a higher risk for hepatitis B, you should be screened for this virus. Talk with your health care provider to find out if you are at risk for hepatitis B infection. Hepatitis C Testing is recommended for:  Everyone born from 1945 through 1965.  Anyone with known risk factors for hepatitis C. Sexually transmitted infections (STIs)  Get screened for STIs, including gonorrhea and chlamydia, if: ? You are sexually active and are younger than 39 years of age. ? You are older than 39 years of age and your health care provider tells you that you are at risk for this type of infection. ? Your sexual activity has changed since you were last screened, and you are at increased risk for chlamydia or gonorrhea. Ask your health care provider   if you are at risk.  Ask your health care provider about whether you are at high risk for HIV. Your health care provider may recommend a prescription medicine to help prevent HIV infection. If you choose to take medicine to prevent HIV, you should first get tested for HIV. You should then be tested every 3 months for as long as you are taking the medicine. Pregnancy  If you are about to stop having your period (premenopausal) and  you may become pregnant, seek counseling before you get pregnant.  Take 400 to 800 micrograms (mcg) of folic acid every day if you become pregnant.  Ask for birth control (contraception) if you want to prevent pregnancy. Osteoporosis and menopause Osteoporosis is a disease in which the bones lose minerals and strength with aging. This can result in bone fractures. If you are 65 years old or older, or if you are at risk for osteoporosis and fractures, ask your health care provider if you should:  Be screened for bone loss.  Take a calcium or vitamin D supplement to lower your risk of fractures.  Be given hormone replacement therapy (HRT) to treat symptoms of menopause. Follow these instructions at home: Lifestyle  Do not use any products that contain nicotine or tobacco, such as cigarettes, e-cigarettes, and chewing tobacco. If you need help quitting, ask your health care provider.  Do not use street drugs.  Do not share needles.  Ask your health care provider for help if you need support or information about quitting drugs. Alcohol use  Do not drink alcohol if: ? Your health care provider tells you not to drink. ? You are pregnant, may be pregnant, or are planning to become pregnant.  If you drink alcohol: ? Limit how much you use to 0-1 drink a day. ? Limit intake if you are breastfeeding.  Be aware of how much alcohol is in your drink. In the [place] one drink equals one 12 oz bottle of beer (355 mL), one 5 oz glass of wine (148 mL), or one 1 oz glass of hard liquor (44 mL). General instructions  Schedule regular health, dental, and eye exams.  Stay current with your vaccines.  Tell your health care provider if: ? You often feel depressed. ? You have ever been abused or do not feel safe at home. Summary  Adopting a healthy lifestyle and getting preventive care are important in promoting health and wellness.  Follow your health care provider's instructions about healthy  diet, exercising, and getting tested or screened for diseases.  Follow your health care provider's instructions on monitoring your cholesterol and blood pressure. This information is not intended to replace advice given to you by your health care provider. Make sure you discuss any questions you have with your health care provider. Document Revised: 10/14/2018 Document Reviewed: 10/14/2018 Elsevier Patient Education  2021 Elsevier Inc.  

## 2021-03-27 LAB — RPR: RPR Ser Ql: NONREACTIVE

## 2021-03-27 LAB — HIV ANTIBODY (ROUTINE TESTING W REFLEX): HIV 1&2 Ab, 4th Generation: NONREACTIVE

## 2021-03-27 LAB — C. TRACHOMATIS/N. GONORRHOEAE RNA
C. trachomatis RNA, TMA: NOT DETECTED
N. gonorrhoeae RNA, TMA: NOT DETECTED

## 2021-03-28 LAB — URINALYSIS, COMPLETE W/RFL CULTURE
Bilirubin Urine: NEGATIVE
Glucose, UA: NEGATIVE
Hgb urine dipstick: NEGATIVE
Hyaline Cast: NONE SEEN /LPF
Ketones, ur: NEGATIVE
Leukocyte Esterase: NEGATIVE
Nitrites, Initial: NEGATIVE
Protein, ur: NEGATIVE
RBC / HPF: NONE SEEN /HPF (ref 0–2)
Specific Gravity, Urine: 1.025 (ref 1.001–1.035)
pH: 6 (ref 5.0–8.0)

## 2021-03-28 LAB — URINE CULTURE
MICRO NUMBER:: 11922181
SPECIMEN QUALITY:: ADEQUATE

## 2021-03-28 LAB — CULTURE INDICATED

## 2021-04-18 ENCOUNTER — Encounter: Payer: Self-pay | Admitting: Nurse Practitioner

## 2021-04-25 ENCOUNTER — Ambulatory Visit (INDEPENDENT_AMBULATORY_CARE_PROVIDER_SITE_OTHER): Payer: No Typology Code available for payment source | Admitting: Nurse Practitioner

## 2021-04-25 ENCOUNTER — Other Ambulatory Visit: Payer: Self-pay

## 2021-04-25 ENCOUNTER — Telehealth: Payer: Self-pay | Admitting: *Deleted

## 2021-04-25 VITALS — HR 77 | Ht 68.0 in | Wt 170.0 lb

## 2021-04-25 DIAGNOSIS — Z3041 Encounter for surveillance of contraceptive pills: Secondary | ICD-10-CM

## 2021-04-25 DIAGNOSIS — N760 Acute vaginitis: Secondary | ICD-10-CM

## 2021-04-25 DIAGNOSIS — Z113 Encounter for screening for infections with a predominantly sexual mode of transmission: Secondary | ICD-10-CM

## 2021-04-25 DIAGNOSIS — N898 Other specified noninflammatory disorders of vagina: Secondary | ICD-10-CM

## 2021-04-25 DIAGNOSIS — B9689 Other specified bacterial agents as the cause of diseases classified elsewhere: Secondary | ICD-10-CM

## 2021-04-25 LAB — WET PREP FOR TRICH, YEAST, CLUE

## 2021-04-25 MED ORDER — METRONIDAZOLE 500 MG PO TABS: 500.0000 mg | ORAL_TABLET | Freq: Two times a day (BID) | ORAL | 0 refills | Status: DC

## 2021-04-25 MED ORDER — METRONIDAZOLE 0.75 % VA GEL: 1.0000 | Freq: Every day | VAGINAL | 0 refills | Status: DC

## 2021-04-25 MED ORDER — NORETHINDRONE 0.35 MG PO TABS
1.0000 | ORAL_TABLET | Freq: Every day | ORAL | 2 refills | Status: DC
Start: 2021-04-25 — End: 2022-02-27

## 2021-04-25 NOTE — Telephone Encounter (Signed)
Gel cancelled with pharmacy left message on patient cell new Rx sent.

## 2021-04-25 NOTE — Progress Notes (Signed)
   Acute Office Visit  Subjective:    Patient ID: Hannah Costa, female    DOB: 06-02-1981, 40 y.o.   MRN: [id number]   HPI 40 y.o. presents today for vaginal discharge. She was treated for BV 03/26/2021 with improvement of symptoms but then had 1-2 days of vaginal discharge and odor prior to her menses. Menses stopped 2 days ago. She does not notice any discharge or odor today. Would like STD screening today. She was on Micronor in the past for pregnancy prevention and started back on this recently. She had an old pack at home and needs refills.    Review of Systems  Constitutional: Negative.   Genitourinary: Negative.       Objective:    Physical Exam Constitutional:      Appearance: Normal appearance.  Genitourinary:    General: Normal vulva.     Vagina: No vaginal discharge or erythema.     Cervix: Normal.    Pulse 77   Ht 5\' 8"  (1.727 m)   Wt 170 lb (77.1 kg)   SpO2 98%   BMI 25.85 kg/m  Wt Readings from Last 3 Encounters:  04/25/21 170 lb (77.1 kg)  05/23/20 173 lb (78.5 kg)  09/21/16 177 lb (80.3 kg)   Wet prep + clue cells     Assessment & Plan:   Problem List Items Addressed This Visit   None Visit Diagnoses     Bacterial vaginosis    -  Primary   Relevant Medications   metroNIDAZOLE (METROGEL) 0.75 % vaginal gel   Vaginal discharge       Relevant Orders   WET PREP FOR TRICH, YEAST, CLUE   Screen for STD (sexually transmitted disease)       Relevant Orders   SURESWAB CT/NG/T. vaginalis   Encounter for surveillance of contraceptive pills       Relevant Medications   norethindrone (ORTHO MICRONOR) 0.35 MG tablet      Plan: Wet prep positive for clue cells - Metrogel 0.75% nightly x 5 nights. Gonorrhea/chlamydia/trich pending. Micronor refills provided and she is aware of proper use. Will return to office if symptoms worsen or do not improve. She is agreeable to plan.     09/23/16 DNP, 8:58 AM 04/25/2021

## 2021-04-25 NOTE — Telephone Encounter (Signed)
Oral metronidazole sent to her pharmacy. Please let pharmacy know to cancel gel. Thank you.

## 2021-04-25 NOTE — Telephone Encounter (Signed)
Hannah Costa I am sorry if I confused you. Patient would like a another Rx for a oral pills for treatment. I don't see you sent in Rx.  She does not want to use the gel for treatment.

## 2021-04-25 NOTE — Telephone Encounter (Signed)
Great. Thank you.

## 2021-04-25 NOTE — Telephone Encounter (Signed)
Patient was seen today was prescribed metrogel 0.75% vaginal gel. Patient called back stating she would prefer to have a oral medication sent to pharmacy. I will call the pharmacy and d/c the metrogel. Please advise

## 2021-04-26 LAB — SURESWAB CT/NG/T. VAGINALIS
C. trachomatis RNA, TMA: NOT DETECTED
N. gonorrhoeae RNA, TMA: NOT DETECTED
Trichomonas vaginalis RNA: NOT DETECTED

## 2021-05-23 NOTE — Progress Notes (Deleted)
   Hannah Costa [birth year] [id number]   History:  40 y.o. K2H0623 presents for annual exam. Regular monthly cycle. Sexually active, uses withdrawal method. Miscarriage 10/2019. Was on depo and mini pill in the past but did not tolerate. Smoker. Would be OK with pregnancy.   Gynecologic History No LMP recorded.   Contraception: none Last Pap: 05/23/2020. Results were: normal, 3-year repeat Last mammogram: Not indicated Last colonoscopy: Not indicated Last Dexa: Not indicated  Past medical history, past surgical history, family history and social history were all reviewed and documented in the EPIC chart.  ROS:  A ROS was performed and pertinent positives and negatives are included.  Exam:  There were no vitals filed for this visit.  There is no height or weight on file to calculate BMI.  General appearance:  Normal Thyroid:  Symmetrical, normal in size, without palpable masses or nodularity. Respiratory  Auscultation:  Clear without wheezing or rhonchi Cardiovascular  Auscultation:  Regular rate, without rubs, murmurs or gallops  Edema/varicosities:  Not grossly evident Abdominal  Soft,nontender, without masses, guarding or rebound.  Liver/spleen:  No organomegaly noted  Hernia:  None appreciated  Skin  Inspection:  Grossly normal   Breasts: Examined lying and sitting.   Right: Without masses, retractions, discharge or axillary adenopathy.   Left: Without masses, retractions, discharge or axillary adenopathy. Gentitourinary   Inguinal/mons:  Normal without inguinal adenopathy  External genitalia:  Normal  BUS/Urethra/Skene's glands:  Normal  Vagina:  Normal  Cervix:  Normal  Uterus:  Difficult to palpate due to body habitus   Adnexa/parametria:     Rt: Without masses or tenderness.   Lt: Without masses or tenderness.  Anus and perineum: Normal  Assessment/Plan:  40 y.o. [id number] for annual exam.  Well female exam with routine gynecological exam - Plan: CBC with  Differential/Platelet, Comprehensive metabolic panel, PAP, ThinPrep ASCUS Rflx HPV Rflx Type. Education provided on SBEs, importance of preventative screenings, current guidelines, high calcium diet, regular exercise, and multivitamin daily.   Lipid screening - Plan: Lipid panel  Screen for STD (sexually transmitted disease) - Plan: RPR, HIV Antibody (routine testing w rflx)  Follow up in 1 year for annual         [name] [facility], 3:36 PM 05/23/2021

## 2021-05-24 ENCOUNTER — Ambulatory Visit (INDEPENDENT_AMBULATORY_CARE_PROVIDER_SITE_OTHER): Payer: No Typology Code available for payment source | Admitting: Nurse Practitioner

## 2021-06-13 ENCOUNTER — Other Ambulatory Visit: Payer: Self-pay

## 2021-06-13 ENCOUNTER — Encounter: Payer: Self-pay | Admitting: Obstetrics and Gynecology

## 2021-06-13 ENCOUNTER — Ambulatory Visit (INDEPENDENT_AMBULATORY_CARE_PROVIDER_SITE_OTHER): Payer: No Typology Code available for payment source | Admitting: Obstetrics and Gynecology

## 2021-06-13 VITALS — BP 122/74 | HR 77 | Resp 14 | Ht 68.0 in | Wt 161.0 lb

## 2021-06-13 DIAGNOSIS — Z113 Encounter for screening for infections with a predominantly sexual mode of transmission: Secondary | ICD-10-CM | POA: Diagnosis not present

## 2021-06-13 DIAGNOSIS — Z202 Contact with and (suspected) exposure to infections with a predominantly sexual mode of transmission: Secondary | ICD-10-CM | POA: Diagnosis not present

## 2021-06-13 MED ORDER — DOXYCYCLINE HYCLATE 100 MG PO CAPS: 100.0000 mg | ORAL_CAPSULE | Freq: Two times a day (BID) | ORAL | 0 refills | Status: DC

## 2021-06-13 NOTE — Progress Notes (Signed)
8GYNECOLOGY  VISIT   HPI: 40 y.o.   Single Black or African American Not Hispanic or Latino  female   [phone] with Patient's last menstrual period was 05/31/2021 (approximate).   here for   STD testing. Patient states that her partner was treated [facility] the [facility] department with 4 pills. She is not sure if he had an STD but would like to be tested to be sure.   Uses condoms sometimes.  She noticed a little vaginal d/c last week, thought she might have had BV. She treated with boric acid x 2 days and symptoms resolved. No pain.  No other symptoms.   GYNECOLOGIC HISTORY: Patient's last menstrual period was 05/31/2021 (approximate). Contraception: POP Menopausal hormone therapy: none         OB History     Gravida  3   Para  1   Term  1   Preterm      AB  2   Living  1      SAB  1   IAB  1   Ectopic      Multiple      Live Births  1              There are no problems to display for this patient.   Past Medical History:  Diagnosis Date   Smoker    3 cig dailiy    Past Surgical History:  Procedure Laterality Date   CESAREAN SECTION      Current Outpatient Medications  Medication Sig Dispense Refill   norethindrone (ORTHO MICRONOR) 0.35 MG tablet Take 1 tablet (0.35 mg total) by mouth daily. 84 tablet 2   No current facility-administered medications for this visit.     ALLERGIES: Patient has no known allergies.  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   Graduate of [employer].   Social Determinants of Health   Financial Resource Strain: Not on file  Food Insecurity: Not on file  Transportation Needs: Not on file  Physical Activity: Not on file  Stress: Not on file  Social Connections: Not on file  Intimate Partner Violence: Not on file    Review of Systems  All other systems reviewed and are negative.  PHYSICAL EXAMINATION:    BP 122/74   Pulse 77  Resp 14   Ht 5\' 8"  (1.727 m)   Wt 161 lb (73 kg)   LMP 05/31/2021 (Approximate)   BMI 24.48 kg/m     General appearance: alert, cooperative and appears stated age   Pelvic: External genitalia:  no lesions              Urethra:  normal appearing urethra with no masses, tenderness or lesions              Bartholins and Skenes: normal                 Vagina: normal appearing vagina with normal color and discharge, no lesions              Cervix: no lesions               Chaperone was present for exam.  1. Screening examination for STD (sexually transmitted disease) - RPR - HIV Antibody (routine testing w rflx) - Hepatitis C antibody - SURESWAB CT/NG/T. vaginalis  2. Exposure to chlamydia (suspected) After discussion she would like to be treated while we await her lab results. - doxycycline (VIBRAMYCIN) 100 MG capsule; Take 1 capsule (100 mg total) by mouth 2 (two) times daily. Take BID for 7 days.  Take with food as can cause GI distress.  Dispense: 14 capsule; Refill: 0

## 2021-06-14 LAB — SURESWAB CT/NG/T. VAGINALIS
C. trachomatis RNA, TMA: NOT DETECTED
N. gonorrhoeae RNA, TMA: NOT DETECTED
Trichomonas vaginalis RNA: NOT DETECTED

## 2021-06-14 LAB — HEPATITIS C ANTIBODY
Hepatitis C Ab: NONREACTIVE
SIGNAL TO CUT-OFF: 0.01

## 2021-06-14 LAB — HIV ANTIBODY (ROUTINE TESTING W REFLEX): HIV 1&2 Ab, 4th Generation: NONREACTIVE

## 2021-06-14 LAB — SYPHILIS: RPR W/REFLEX TO RPR TITER AND TREPONEMAL ANTIBODIES, TRADITIONAL SCREENING AND DIAGNOSIS ALGORITHM: RPR Ser Ql: NONREACTIVE

## 2021-12-13 ENCOUNTER — Other Ambulatory Visit: Payer: Self-pay

## 2021-12-13 DIAGNOSIS — Z789 Other specified health status: Secondary | ICD-10-CM

## 2021-12-13 MED ORDER — LEVONORGESTREL 1.5 MG PO TABS: 1.5000 mg | ORAL_TABLET | Freq: Once | ORAL | 0 refills | Status: AC

## 2021-12-13 NOTE — Telephone Encounter (Signed)
Pt calling to request an Plan B Rx be sent to pharmacy on file. She is aware she can get it OTC but reports she can use her FSA when it is Rx'd. Please advise.   FYI. Last AEX 05/23/20.

## 2021-12-28 NOTE — Telephone Encounter (Signed)
MyChart msg came back unread. I called and left detailed VM re: previous msg per DPR.

## 2022-02-25 ENCOUNTER — Telehealth: Payer: Self-pay | Admitting: *Deleted

## 2022-02-25 NOTE — Telephone Encounter (Signed)
Patient called scheduled for annual exam on 02/27/22. States she interested in Nexplanon wanted to know the process. I advised patient to discuss with Tiffany at annual exam and this will start the process. Patient said she would talk with Tiffany at office visit. ?

## 2022-02-27 ENCOUNTER — Ambulatory Visit (INDEPENDENT_AMBULATORY_CARE_PROVIDER_SITE_OTHER): Payer: No Typology Code available for payment source | Admitting: Nurse Practitioner

## 2022-02-27 ENCOUNTER — Encounter: Payer: Self-pay | Admitting: Nurse Practitioner

## 2022-02-27 VITALS — BP 110/70 | Ht 67.0 in | Wt 169.0 lb

## 2022-02-27 DIAGNOSIS — Z113 Encounter for screening for infections with a predominantly sexual mode of transmission: Secondary | ICD-10-CM

## 2022-02-27 DIAGNOSIS — Z01419 Encounter for gynecological examination (general) (routine) without abnormal findings: Secondary | ICD-10-CM

## 2022-02-27 DIAGNOSIS — Z3009 Encounter for other general counseling and advice on contraception: Secondary | ICD-10-CM

## 2022-02-27 NOTE — Progress Notes (Signed)
? ?Destinae Neubecker Aug 15, 1981 [id number] ? ? ?History:  41 y.o. [id number] presents for annual exam and to discuss contraception. Interested in [place]. Has done Depo and mini pill in the past. Monthly cycles. Sexually active, using condoms. Quit smoking 3 months ago.  ? ?Gynecologic History ?Patient's last menstrual period was 02/13/2022. ?Period Cycle (Days): 25 ?Period Duration (Days): 5 ?Period Pattern: Regular ?Menstrual Flow: Moderate, Heavy ?Dysmenorrhea: (!) Moderate ?Dysmenorrhea Symptoms: Cramping ?Contraception/Family planning: Condoms ?Sexually active: Yes ? ?Health Maintenance ?Last Pap: 05/24/2020. Results were: Normal, 3-year repeat ?Last mammogram: Not indicated ?Last colonoscopy: Not indicated ?Last Dexa: Not indicated ? ?Past medical history, past surgical history, family history and social history were all reviewed and documented in the EPIC chart. Works for [employer]. 80 yo daughter.  ? ?ROS:  A ROS was performed and pertinent positives and negatives are included. ? ?Exam: ? ?Vitals:  ? 02/27/22 1356  ?BP: 110/70  ?Weight: 169 lb (76.7 kg)  ?Height: 5\' 7"  (1.702 m)  ? ? ?Body mass index is 26.47 kg/m?. ? ?General appearance:  Normal ?Thyroid:  Symmetrical, normal in size, without palpable masses or nodularity. ?Respiratory ? Auscultation:  Clear without wheezing or rhonchi ?Cardiovascular ? Auscultation:  Regular rate, without rubs, murmurs or gallops ? Edema/varicosities:  Not grossly evident ?Abdominal ? Soft,nontender, without masses, guarding or rebound. ? Liver/spleen:  No organomegaly noted ? Hernia:  None appreciated ? Skin ? Inspection:  Grossly normal ?  ?Breasts: Examined lying and sitting.  ? Right: Without masses, retractions, discharge or axillary adenopathy. ? ? Left: Without masses, retractions, discharge or axillary adenopathy. ?Genitourinary  ? Inguinal/mons:  Normal without inguinal adenopathy ? External genitalia:  Normal appearing vulva with no masses, tenderness, or  lesions ? BUS/Urethra/Skene's glands:  Normal ? Vagina:  Normal appearing with normal color and discharge, no lesions ? Cervix:  Normal appearing without discharge or lesions ? Uterus:  Normal in size, shape and contour.  Midline and mobile, nontender ? Adnexa/parametria:   ?  Rt: Normal in size, without masses or tenderness. ?  Lt: Normal in size, without masses or tenderness. ? Anus and perineum: Normal ? Digital rectal exam: Not indicated ? ?Patient informed chaperone available to be present for breast and pelvic exam. Patient has requested no chaperone to be present. Patient has been advised what will be completed during breast and pelvic exam.  ? ?Assessment/Plan:  41 y.o. 25 to reestablish care.  ? ?Well female exam with routine gynecological exam - Plan: CBC with Differential/Platelet, Comprehensive metabolic panel. Education provided on SBEs, importance of preventative screenings, current guidelines, high calcium diet, regular exercise, and multivitamin daily.  ? ?General counseling and advice on female contraception - Plan: Insertion of implanon rod. Contraceptive options were reviewed, including hormonal methods, both combination (pill, patch, vaginal ring) and progesterone-only (pill, Depo Provera and Nexplanon), intrauterine devices (Mirena, [place], Dresser, and Paragard), Phexxi, barrier methods (condoms, diaphragm) and female/female sterilization. The mechanisms, risks, benefits and side effects of all methods were discussed. She is interested in Nexplanon and will schedule during next menses.  ? ?Screen for STD (sexually transmitted disease) - Plan: RPR, HIV Antibody (routine testing w rflx), SURESWAB CT/NG/T. vaginalis ? ?Screening for cervical cancer - Normal Pap history.  Will repeat at 3-year interval per guidelines. ? ?Screening for breast cancer - Normal breast exam today. Discussed current guidelines and recommendations to start screenings at age 48.  ? ?Follow up in 1 year for  annual. ? ? ? ? ? ? ? ?Sabastian Raimondi 42  WHNP, 2:12 PM 02/27/2022 ? ?

## 2022-02-28 LAB — COMPREHENSIVE METABOLIC PANEL
AG Ratio: 1.3 (calc) (ref 1.0–2.5)
ALT: 7 U/L (ref 6–29)
AST: 12 U/L (ref 10–30)
Albumin: 4.1 g/dL (ref 3.6–5.1)
Alkaline phosphatase (APISO): 50 U/L (ref 31–125)
BUN: 8 mg/dL (ref 7–25)
CO2: 22 mmol/L (ref 20–32)
Calcium: 9.2 mg/dL (ref 8.6–10.2)
Chloride: 108 mmol/L (ref 98–110)
Creat: 0.89 mg/dL (ref 0.50–0.97)
Globulin: 3.2 g/dL (calc) (ref 1.9–3.7)
Glucose, Bld: 97 mg/dL (ref 65–99)
Potassium: 4.2 mmol/L (ref 3.5–5.3)
Sodium: 138 mmol/L (ref 135–146)
Total Bilirubin: 0.5 mg/dL (ref 0.2–1.2)
Total Protein: 7.3 g/dL (ref 6.1–8.1)

## 2022-02-28 LAB — CBC WITH DIFFERENTIAL/PLATELET
Absolute Monocytes: 851 cells/uL (ref 200–950)
Basophils Absolute: 20 cells/uL (ref 0–200)
Basophils Relative: 0.3 %
Eosinophils Absolute: 121 cells/uL (ref 15–500)
Eosinophils Relative: 1.8 %
HCT: 40.8 % (ref 35.0–45.0)
Hemoglobin: 13.4 g/dL (ref 11.7–15.5)
Lymphs Abs: 2955 cells/uL (ref 850–3900)
MCH: 28.8 pg (ref 27.0–33.0)
MCHC: 32.8 g/dL (ref 32.0–36.0)
MCV: 87.7 fL (ref 80.0–100.0)
MPV: 11.8 fL (ref 7.5–12.5)
Monocytes Relative: 12.7 %
Neutro Abs: 2754 cells/uL (ref 1500–7800)
Neutrophils Relative %: 41.1 %
Platelets: 198 10*3/uL (ref 140–400)
RBC: 4.65 10*6/uL (ref 3.80–5.10)
RDW: 13.5 % (ref 11.0–15.0)
Total Lymphocyte: 44.1 %
WBC: 6.7 10*3/uL (ref 3.8–10.8)

## 2022-02-28 LAB — RPR: RPR Ser Ql: NONREACTIVE

## 2022-02-28 LAB — HIV ANTIBODY (ROUTINE TESTING W REFLEX): HIV 1&2 Ab, 4th Generation: NONREACTIVE

## 2022-02-28 LAB — SURESWAB CT/NG/T. VAGINALIS
C. trachomatis RNA, TMA: NOT DETECTED
N. gonorrhoeae RNA, TMA: NOT DETECTED
Trichomonas vaginalis RNA: NOT DETECTED

## 2022-03-14 ENCOUNTER — Ambulatory Visit (INDEPENDENT_AMBULATORY_CARE_PROVIDER_SITE_OTHER): Payer: No Typology Code available for payment source | Admitting: Nurse Practitioner

## 2022-03-14 ENCOUNTER — Encounter: Payer: Self-pay | Admitting: Nurse Practitioner

## 2022-03-14 VITALS — BP 120/76

## 2022-03-14 DIAGNOSIS — Z30017 Encounter for initial prescription of implantable subdermal contraceptive: Secondary | ICD-10-CM

## 2022-03-14 DIAGNOSIS — Z01818 Encounter for other preprocedural examination: Secondary | ICD-10-CM

## 2022-03-14 DIAGNOSIS — Z3009 Encounter for other general counseling and advice on contraception: Secondary | ICD-10-CM

## 2022-03-14 NOTE — Progress Notes (Signed)
41 y.o. [id number] female presents for Nexplanon insertion.  She has been counseled about alternative types of contraception and has decided this long acting method is the best for her.  Procedure, risks and benefits have all been explained.  She has the following questions today:  None.    ? ?LMP:  03/07/2022  ? ?After all questions were answered, consent was obtained.   ? ?Past Medical History:  ?Diagnosis Date  ? Smoker   ? 3 cig dailiy  ? ? ?Past Surgical History:  ?Procedure Laterality Date  ? CESAREAN SECTION    ? ? ?Current Outpatient Medications on File Prior to Visit  ?Medication Sig Dispense Refill  ? IBUPROFEN PO Take by mouth as needed.    ? ?No current facility-administered medications on file prior to visit.  ? ?No Known Allergies ? ?Vitals:  ? 03/14/22 1356  ?BP: 120/76  ? ?Physical Exam ? ?Procedure: Patient placed supine on exam table with her left arm flexed at the elbow. The location for insertion site was identified 8-10 cm from epicondyle and 3-5 cm posterior.  Area cleansed with Betadine x 3.  Insertion site and path of insertion was anesthetized with 1% Lidocaine without epinephrine, 1.5 cc total.  Using Nexplanon device (and after confirming presence of rod in device), skin was pierced and then elevated along insertion path, passing device just under the skin.  Rod released and device inserted.  Rod palpated easily.  Steri-strips applied and a pressure bandage was place over the site.  Entire procedure performed with sterile technique. ? ?Assessment: Nexplanon insertion. ? ?Plan:  Post procedure instructions reviewed with pt.  Questions answered.  Pt knows to call with any concerns or questions.  Pt is aware removal is due by 3 calendar years from today.  ?

## 2022-03-21 ENCOUNTER — Encounter: Payer: Self-pay | Admitting: Gynecology

## 2023-08-07 ENCOUNTER — Ambulatory Visit: Payer: No Typology Code available for payment source | Admitting: Nurse Practitioner

## 2023-09-17 ENCOUNTER — Other Ambulatory Visit: Payer: Self-pay

## 2023-09-17 ENCOUNTER — Encounter (HOSPITAL_BASED_OUTPATIENT_CLINIC_OR_DEPARTMENT_OTHER): Payer: Self-pay | Admitting: Emergency Medicine

## 2023-09-17 ENCOUNTER — Emergency Department (HOSPITAL_BASED_OUTPATIENT_CLINIC_OR_DEPARTMENT_OTHER)
Admission: EM | Admit: 2023-09-17 | Discharge: 2023-09-17 | Disposition: A | Discharge status: Home / Self Care | Payer: Self-pay | Attending: Emergency Medicine | Admitting: Emergency Medicine

## 2023-09-17 ENCOUNTER — Emergency Department (HOSPITAL_BASED_OUTPATIENT_CLINIC_OR_DEPARTMENT_OTHER): Payer: Self-pay | Admitting: Radiology

## 2023-09-17 DIAGNOSIS — S39012A Strain of muscle, fascia and tendon of lower back, initial encounter: Secondary | ICD-10-CM | POA: Insufficient documentation

## 2023-09-17 DIAGNOSIS — Y92512 Supermarket, store or market as the place of occurrence of the external cause: Secondary | ICD-10-CM | POA: Insufficient documentation

## 2023-09-17 DIAGNOSIS — S60212A Contusion of left wrist, initial encounter: Secondary | ICD-10-CM | POA: Insufficient documentation

## 2023-09-17 DIAGNOSIS — W19XXXA Unspecified fall, initial encounter: Secondary | ICD-10-CM

## 2023-09-17 DIAGNOSIS — W010XXA Fall on same level from slipping, tripping and stumbling without subsequent striking against object, initial encounter: Secondary | ICD-10-CM | POA: Insufficient documentation

## 2023-09-17 LAB — PREGNANCY, URINE: Preg Test, Ur: NEGATIVE

## 2023-09-17 MED ORDER — KETOROLAC TROMETHAMINE 30 MG/ML IJ SOLN
15.0000 mg | Freq: Once | INTRAMUSCULAR | Status: AC
  Administered 2023-09-17: 15 mg via INTRAMUSCULAR
  Filled 2023-09-17: qty 1

## 2023-09-17 MED ORDER — METHOCARBAMOL 500 MG PO TABS: 500.0000 mg | ORAL_TABLET | Freq: Two times a day (BID) | ORAL | 0 refills | Status: DC

## 2023-09-17 MED ORDER — NAPROXEN 500 MG PO TABS: 500.0000 mg | ORAL_TABLET | Freq: Two times a day (BID) | ORAL | 0 refills | Status: DC

## 2023-09-17 NOTE — ED Triage Notes (Signed)
Patient slipped in water at a store, landing on her bottom.  Patient c/o bilateral hip pain, lower back pain and left wrist pain.

## 2023-09-17 NOTE — ED Provider Notes (Signed)
[place] EMERGENCY DEPARTMENT AT [facility]  Provider Note  CSN: [id number] Arrival date & time: 09/17/23 2134  History Chief Complaint  Patient presents with   Hannah Costa    Hannah Costa is a 42 y.o. female reports she slipped and fell at the grocery store earlier tonight, landing on her buttocks and injuring her lower back, bilateral hips and L wrist. Has not taken anything for pain.    Home Medications Prior to Admission medications   Medication Sig Start Date End Date Taking? Authorizing Provider  methocarbamol (ROBAXIN) 500 MG tablet Take 1 tablet (500 mg total) by mouth 2 (two) times daily. 09/17/23  Yes Pollyann Savoy, MD  naproxen (NAPROSYN) 500 MG tablet Take 1 tablet (500 mg total) by mouth 2 (two) times daily. 09/17/23  Yes Pollyann Savoy, MD     Allergies    Patient has no known allergies.   Review of Systems   Review of Systems Please see HPI for pertinent positives and negatives  Physical Exam BP 135/82 (BP Location: Right Arm)   Pulse (!) 107   Temp 98.4 F (36.9 C) (Temporal)   Resp 18   Wt 77.1 kg   SpO2 98%   BMI 26.63 kg/m   Physical Exam Vitals and nursing note reviewed.  Constitutional:      Appearance: Normal appearance.  HENT:     Head: Normocephalic and atraumatic.     Nose: Nose normal.     Mouth/Throat:     Mouth: Mucous membranes are moist.  Eyes:     Extraocular Movements: Extraocular movements intact.     Conjunctiva/sclera: Conjunctivae normal.  Cardiovascular:     Rate and Rhythm: Normal rate.  Pulmonary:     Effort: Pulmonary effort is normal.     Breath sounds: Normal breath sounds.  Abdominal:     General: Abdomen is flat.     Palpations: Abdomen is soft.     Tenderness: There is no abdominal tenderness.  Musculoskeletal:        General: Tenderness (midline and paraspinal lumbar; L ulnar wrist, no snuffbox tenderness) present. No swelling or deformity. Normal range of motion.     Cervical back: Neck  supple.  Skin:    General: Skin is warm and dry.  Neurological:     General: No focal deficit present.     Mental Status: She is alert.  Psychiatric:        Mood and Affect: Mood normal.     ED Results / Procedures / Treatments   EKG None  Procedures Procedures  Medications Ordered in the ED Medications  ketorolac (TORADOL) 30 MG/ML injection 15 mg (15 mg Intramuscular Given 09/17/23 2322)    Initial Impression and Plan  Patient here for mechanical fall, xrays of her areas of concern are pending. Exam is overall reassuring. Toradol for pain.   ED Course   Clinical Course as of 09/17/23 2335  Wed Sep 17, 2023  2332 I personally viewed the images from radiology studies and agree with radiologist interpretation: Xrays neg for acute fracture. Plan discharge with Rx for Naprosyn, Robaxin and recommend rest, ice and elevation  [CS]    Clinical Course User Index [CS] Pollyann Savoy, MD     MDM Rules/Calculators/A&P Medical Decision Making Problems Addressed: Contusion of left wrist, initial encounter: acute illness or injury Fall, initial encounter: acute illness or injury Lumbar strain, initial encounter: acute illness or injury  Amount and/or Complexity of Data Reviewed Labs: ordered. Radiology:  ordered and independent interpretation performed. Decision-making details documented in ED Course.  Risk Prescription drug management.     Final Clinical Impression(s) / ED Diagnoses Final diagnoses:  Fall, initial encounter  Contusion of left wrist, initial encounter  Lumbar strain, initial encounter    Rx / DC Orders ED Discharge Orders          Ordered    naproxen (NAPROSYN) 500 MG tablet  2 times daily        09/17/23 2335    methocarbamol (ROBAXIN) 500 MG tablet  2 times daily        09/17/23 2335             Pollyann Savoy, MD 09/17/23 [phone]

## 2023-10-23 ENCOUNTER — Other Ambulatory Visit (HOSPITAL_COMMUNITY)
Admission: RE | Admit: 2023-10-23 | Discharge: 2023-10-23 | Disposition: A | Discharge status: Home / Self Care | Payer: No Typology Code available for payment source | Source: Ambulatory Visit | Attending: Nurse Practitioner | Admitting: Nurse Practitioner

## 2023-10-23 ENCOUNTER — Encounter: Payer: Self-pay | Admitting: Nurse Practitioner

## 2023-10-23 ENCOUNTER — Ambulatory Visit (INDEPENDENT_AMBULATORY_CARE_PROVIDER_SITE_OTHER): Payer: No Typology Code available for payment source | Admitting: Nurse Practitioner

## 2023-10-23 VITALS — BP 122/84 | HR 106 | Ht 65.0 in | Wt 199.0 lb

## 2023-10-23 DIAGNOSIS — Z113 Encounter for screening for infections with a predominantly sexual mode of transmission: Secondary | ICD-10-CM

## 2023-10-23 DIAGNOSIS — Z124 Encounter for screening for malignant neoplasm of cervix: Secondary | ICD-10-CM | POA: Insufficient documentation

## 2023-10-23 DIAGNOSIS — Z3046 Encounter for surveillance of implantable subdermal contraceptive: Secondary | ICD-10-CM | POA: Diagnosis not present

## 2023-10-23 DIAGNOSIS — Z01419 Encounter for gynecological examination (general) (routine) without abnormal findings: Secondary | ICD-10-CM

## 2023-10-23 DIAGNOSIS — Z13220 Encounter for screening for lipoid disorders: Secondary | ICD-10-CM

## 2023-10-23 NOTE — Progress Notes (Signed)
Hannah Costa 11-06-1980 [id number]   History:  42 y.o. [id number] presents for annual exam. Nexplanon May 2023. Wants Nexplanon removed due to weight gain. Not interested in alternative at this time. Sexually active occasionally, uses condoms. Normal pap history.   Gynecologic History No LMP recorded. Patient has had an implant.   Contraception/Family planning: Nexplanon Sexually active: Yes  Health Maintenance Last Pap: 05/24/2020. Results were: Normal, 3-year repeat Last mammogram: Never Last colonoscopy: Not indicated Last Dexa: Not indicated  Past medical history, past surgical history, family history and social history were all reviewed and documented in the EPIC chart. Works for [employer]. 63 yo daughter, senior in HS.   ROS:  A ROS was performed and pertinent positives and negatives are included.  Exam:  Vitals:   10/23/23 0903  BP: 122/84  Pulse: (!) 106  SpO2: 100%  Weight: 199 lb (90.3 kg)  Height: 5\' 5"  (1.651 m)     Body mass index is 33.12 kg/m.  General appearance:  Normal Thyroid:  Symmetrical, normal in size, without palpable masses or nodularity. Respiratory  Auscultation:  Clear without wheezing or rhonchi Cardiovascular  Auscultation:  Regular rate, without rubs, murmurs or gallops  Edema/varicosities:  Not grossly evident Abdominal  Soft,nontender, without masses, guarding or rebound.  Liver/spleen:  No organomegaly noted  Hernia:  None appreciated  Skin  Inspection:  Grossly normal   Breasts: Examined lying and sitting.   Right: Without masses, retractions, discharge or axillary adenopathy.   Left: Without masses, retractions, discharge or axillary adenopathy. Pelvic: External genitalia:  no lesions              Urethra:  normal appearing urethra with no masses, tenderness or lesions              Bartholins and Skenes: normal                 Vagina: normal appearing vagina with normal color and discharge, no lesions              Cervix: no  lesions Bimanual Exam:  Uterus:  no masses or tenderness              Adnexa: no mass, fullness, tenderness              Rectovaginal: Deferred              Anus:  normal, no lesions  Patient informed chaperone available to be present for breast and pelvic exam. Patient has requested no chaperone to be present. Patient has been advised what will be completed during breast and pelvic exam.   Assessment/Plan:  42 y.o. [id number] for annual exam.   Well female exam with routine gynecological exam - Plan: CBC with Differential/Platelet, Comprehensive metabolic panel. Education provided on SBEs, importance of preventative screenings, current guidelines, high calcium diet, regular exercise, and multivitamin daily.   Cervical cancer screening - Plan: Cytology - PAP( [place]). Normal pap history.   Encounter for surveillance of implantable subdermal contraceptive - Plan: Removal of implanon rod. Complains of weight gain since insertion May 2023. Wants removed. Not interested in alternative at this time.   Screening for lipid disorders - Plan: Lipid panel  Screening examination for STD (sexually transmitted disease) - Plan: Cytology - PAP( [place]). GC/CT/trich added to pap.   Screening for breast cancer - Normal breast exam today. Discussed current guidelines and screening recommendations. Information provided on local imaging centers.   Return in about 1 year (around  10/22/2024) for Annual.        [name] [facility], 9:24 AM 10/23/2023

## 2023-10-24 LAB — COMPREHENSIVE METABOLIC PANEL
AG Ratio: 1.4 (calc) (ref 1.0–2.5)
ALT: 11 U/L (ref 6–29)
AST: 15 U/L (ref 10–30)
Albumin: 4.1 g/dL (ref 3.6–5.1)
Alkaline phosphatase (APISO): 70 U/L (ref 31–125)
BUN: 10 mg/dL (ref 7–25)
CO2: 23 mmol/L (ref 20–32)
Calcium: 9.1 mg/dL (ref 8.6–10.2)
Chloride: 106 mmol/L (ref 98–110)
Creat: 0.75 mg/dL (ref 0.50–0.99)
Globulin: 3 g/dL (ref 1.9–3.7)
Glucose, Bld: 89 mg/dL (ref 65–99)
Potassium: 4.1 mmol/L (ref 3.5–5.3)
Sodium: 137 mmol/L (ref 135–146)
Total Bilirubin: 0.5 mg/dL (ref 0.2–1.2)
Total Protein: 7.1 g/dL (ref 6.1–8.1)

## 2023-10-24 LAB — CBC WITH DIFFERENTIAL/PLATELET
Absolute Lymphocytes: 2919 {cells}/uL (ref 850–3900)
Absolute Monocytes: 927 {cells}/uL (ref 200–950)
Basophils Absolute: 16 {cells}/uL (ref 0–200)
Basophils Relative: 0.2 %
Eosinophils Absolute: 82 {cells}/uL (ref 15–500)
Eosinophils Relative: 1 %
HCT: 41.9 % (ref 35.0–45.0)
Hemoglobin: 13.5 g/dL (ref 11.7–15.5)
MCH: 28.2 pg (ref 27.0–33.0)
MCHC: 32.2 g/dL (ref 32.0–36.0)
MCV: 87.5 fL (ref 80.0–100.0)
MPV: 11.9 fL (ref 7.5–12.5)
Monocytes Relative: 11.3 %
Neutro Abs: 4256 {cells}/uL (ref 1500–7800)
Neutrophils Relative %: 51.9 %
Platelets: 252 10*3/uL (ref 140–400)
RBC: 4.79 10*6/uL (ref 3.80–5.10)
RDW: 12.9 % (ref 11.0–15.0)
Total Lymphocyte: 35.6 %
WBC: 8.2 10*3/uL (ref 3.8–10.8)

## 2023-10-24 LAB — LIPID PANEL
Cholesterol: 139 mg/dL (ref <200)
HDL: 38 mg/dL — ABNORMAL LOW (ref >=50)
LDL Cholesterol (Calc): 82 mg/dL
Non-HDL Cholesterol (Calc): 101 mg/dL (ref <130)
Total CHOL/HDL Ratio: 3.7 (calc) (ref <5.0)
Triglycerides: 98 mg/dL (ref <150)

## 2023-10-28 LAB — CYTOLOGY - PAP
Chlamydia: NEGATIVE
Comment: NEGATIVE
Comment: NEGATIVE
Comment: NEGATIVE
Comment: NORMAL
Diagnosis: NEGATIVE
High risk HPV: NEGATIVE
Neisseria Gonorrhea: NEGATIVE
Trichomonas: NEGATIVE

## 2023-11-12 ENCOUNTER — Ambulatory Visit: Payer: No Typology Code available for payment source | Admitting: Nurse Practitioner

## 2023-11-12 VITALS — BP 118/80 | HR 88 | Wt 203.0 lb

## 2023-11-12 DIAGNOSIS — Z3046 Encounter for surveillance of implantable subdermal contraceptive: Secondary | ICD-10-CM | POA: Diagnosis not present

## 2023-11-12 DIAGNOSIS — Z30018 Encounter for initial prescription of other contraceptives: Secondary | ICD-10-CM

## 2023-11-12 MED ORDER — PHEXXI 1.8-1-0.4 % VA GEL
1.0000 | Freq: Every day | VAGINAL | 1 refills | Status: AC
Start: 2023-11-12 — End: ?

## 2023-11-12 MED ORDER — PHEXXI 1.8-1-0.4 % VA GEL: 1.0000 | Freq: Every day | VAGINAL | 1 refills | Status: DC

## 2023-11-12 NOTE — Progress Notes (Signed)
 43 y.o. [id number] presents for Nexplanon  removal.  She has been experiencing weight gain.  Interested in Phexxi . Procedure, risks and benefits have all been explained.  She has the following questions today:  None.     LMP:  10/05/2023   After all questions were answered, consent was obtained.    Past Medical History:  Diagnosis Date   Smoker    3 cig dailiy    Past Surgical History:  Procedure Laterality Date   CESAREAN SECTION      No current outpatient medications on file prior to visit.   No current facility-administered medications on file prior to visit.   No Known Allergies  Vitals:   11/12/23 1403  BP: 118/80  Pulse: 88  SpO2: 100%   Physical Exam  Procedure: Patient placed supine on exam table with her left arm flexed at the elbow. The prior insertion site was located and the Nexplanon  rod was palpated.  Area cleansed with Betadine x 3 and draped in normal sterile fashion.  Insertion site and surrounding tissue anesthetized with 1% Lidocaine without epinephrine, 2cc total used.  Small incision made with #11 blade.  Nexplanon  removed without difficulty.  Steri-strips were applied and pressure dressing placed over the site.  Entire procedure performed with sterile technique.  Pt tolerated procedure well.  Heinz Senters, CMA assisted.  Assessment: Nexplanon  removal  Plan:  Post procedure instructions reviewed with pt.  Questions answered.  Pt knows to call with any concerns or questions.  New contraception:  Phexxi . Educated on proper use.
